# Patient Record
Sex: Male | Born: 1985 | Race: Black or African American | Hispanic: No | Marital: Single | Smoking: Current every day smoker
Health system: Southern US, Community
[De-identification: ages and names within clinical notes are randomized; demographics above are authoritative.]

## PROBLEM LIST (undated history)

## (undated) DIAGNOSIS — J45909 Unspecified asthma, uncomplicated: Secondary | ICD-10-CM

---

## 2012-06-27 ENCOUNTER — Emergency Department (HOSPITAL_COMMUNITY)
Admission: EM | Admit: 2012-06-27 | Discharge: 2012-06-27 | Disposition: A | Discharge status: Home / Self Care | Payer: Self-pay | Attending: Emergency Medicine | Admitting: Emergency Medicine

## 2012-06-27 ENCOUNTER — Encounter (HOSPITAL_COMMUNITY): Payer: Self-pay | Admitting: Cardiology

## 2012-06-27 DIAGNOSIS — F17200 Nicotine dependence, unspecified, uncomplicated: Secondary | ICD-10-CM | POA: Insufficient documentation

## 2012-06-27 DIAGNOSIS — J020 Streptococcal pharyngitis: Secondary | ICD-10-CM | POA: Insufficient documentation

## 2012-06-27 DIAGNOSIS — J45909 Unspecified asthma, uncomplicated: Secondary | ICD-10-CM | POA: Insufficient documentation

## 2012-06-27 DIAGNOSIS — R509 Fever, unspecified: Secondary | ICD-10-CM | POA: Insufficient documentation

## 2012-06-27 HISTORY — DX: Unspecified asthma, uncomplicated: J45.909

## 2012-06-27 MED ORDER — PENICILLIN G BENZATHINE 1200000 UNIT/2ML IM SUSP
1.2000 10*6.[IU] | Freq: Once | INTRAMUSCULAR | Status: AC
  Administered 2012-06-27: 1.2 10*6.[IU] via INTRAMUSCULAR
  Filled 2012-06-27: qty 2

## 2012-06-27 MED ORDER — OXYCODONE-ACETAMINOPHEN 5-325 MG PO TABS
1.0000 | ORAL_TABLET | Freq: Once | ORAL | Status: AC
  Administered 2012-06-27: 1 via ORAL
  Filled 2012-06-27: qty 1

## 2012-06-27 MED ORDER — HYDROCODONE-ACETAMINOPHEN 7.5-325 MG/15ML PO SOLN: 15.0000 mL | Freq: Four times a day (QID) | ORAL | Status: DC | PRN
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
## 2015-02-17 ENCOUNTER — Encounter (HOSPITAL_COMMUNITY): Payer: Self-pay | Admitting: *Deleted

## 2015-02-17 ENCOUNTER — Emergency Department (HOSPITAL_COMMUNITY): Payer: Self-pay

## 2015-02-17 ENCOUNTER — Emergency Department (HOSPITAL_COMMUNITY)
Admission: EM | Admit: 2015-02-17 | Discharge: 2015-02-17 | Disposition: A | Discharge status: Home / Self Care | Payer: Self-pay | Attending: Emergency Medicine | Admitting: Emergency Medicine

## 2015-02-17 DIAGNOSIS — Y998 Other external cause status: Secondary | ICD-10-CM | POA: Insufficient documentation

## 2015-02-17 DIAGNOSIS — F911 Conduct disorder, childhood-onset type: Secondary | ICD-10-CM | POA: Insufficient documentation

## 2015-02-17 DIAGNOSIS — S299XXA Unspecified injury of thorax, initial encounter: Secondary | ICD-10-CM | POA: Insufficient documentation

## 2015-02-17 DIAGNOSIS — S70211A Abrasion, right hip, initial encounter: Secondary | ICD-10-CM | POA: Insufficient documentation

## 2015-02-17 DIAGNOSIS — Y9389 Activity, other specified: Secondary | ICD-10-CM | POA: Insufficient documentation

## 2015-02-17 DIAGNOSIS — S3991XA Unspecified injury of abdomen, initial encounter: Secondary | ICD-10-CM

## 2015-02-17 DIAGNOSIS — S24109A Unspecified injury at unspecified level of thoracic spinal cord, initial encounter: Secondary | ICD-10-CM | POA: Insufficient documentation

## 2015-02-17 DIAGNOSIS — Y92410 Unspecified street and highway as the place of occurrence of the external cause: Secondary | ICD-10-CM | POA: Insufficient documentation

## 2015-02-17 LAB — TYPE AND SCREEN
ABO/RH(D): B POS
Antibody Screen: NEGATIVE
UNIT DIVISION: 0
UNIT DIVISION: 0

## 2015-02-17 LAB — COMPREHENSIVE METABOLIC PANEL
ALT: 51 U/L (ref 17–63)
AST: 119 U/L — AB (ref 15–41)
Albumin: 4.2 g/dL (ref 3.5–5.0)
Alkaline Phosphatase: 57 U/L (ref 38–126)
Anion gap: 12 (ref 5–15)
BUN: 10 mg/dL (ref 6–20)
CALCIUM: 9 mg/dL (ref 8.9–10.3)
CO2: 26 mmol/L (ref 22–32)
CREATININE: 1.3 mg/dL — AB (ref 0.61–1.24)
Chloride: 104 mmol/L (ref 101–111)
GFR calc Af Amer: >60 mL/min (ref >60)
GFR calc non Af Amer: >60 mL/min (ref >60)
Glucose, Bld: 99 mg/dL (ref 65–99)
Potassium: 3.1 mmol/L — ABNORMAL LOW (ref 3.5–5.1)
SODIUM: 142 mmol/L (ref 135–145)
TOTAL PROTEIN: 7.6 g/dL (ref 6.5–8.1)
Total Bilirubin: 1.1 mg/dL (ref 0.3–1.2)

## 2015-02-17 LAB — CBC
HCT: 43.5 % (ref 39.0–52.0)
HEMOGLOBIN: 15 g/dL (ref 13.0–17.0)
MCH: 29.6 pg (ref 26.0–34.0)
MCHC: 34.5 g/dL (ref 30.0–36.0)
MCV: 86 fL (ref 78.0–100.0)
Platelets: 228 10*3/uL (ref 150–400)
RBC: 5.06 MIL/uL (ref 4.22–5.81)
RDW: 13.7 % (ref 11.5–15.5)
WBC: 10.4 10*3/uL (ref 4.0–10.5)

## 2015-02-17 LAB — BLOOD PRODUCT ORDER (VERBAL) VERIFICATION

## 2015-02-17 LAB — I-STAT CHEM 8, ED
BUN: 11 mg/dL (ref 6–20)
CALCIUM ION: 1.1 mmol/L — AB (ref 1.12–1.23)
Chloride: 103 mmol/L (ref 101–111)
Creatinine, Ser: 1.5 mg/dL — ABNORMAL HIGH (ref 0.61–1.24)
GLUCOSE: 97 mg/dL (ref 65–99)
HEMATOCRIT: 49 % (ref 39.0–52.0)
Hemoglobin: 16.7 g/dL (ref 13.0–17.0)
POTASSIUM: 3.1 mmol/L — AB (ref 3.5–5.1)
Sodium: 143 mmol/L (ref 135–145)
TCO2: 24 mmol/L (ref 0–100)

## 2015-02-17 LAB — PREPARE FRESH FROZEN PLASMA
UNIT DIVISION: 0
UNIT DIVISION: 0

## 2015-02-17 LAB — CDS SEROLOGY

## 2015-02-17 LAB — ABO/RH: ABO/RH(D): B POS

## 2015-02-17 LAB — ETHANOL: Alcohol, Ethyl (B): 119 mg/dL — ABNORMAL HIGH (ref <5)

## 2015-02-17 LAB — PROTIME-INR
INR: 1.12 (ref 0.00–1.49)
Prothrombin Time: 14.6 seconds (ref 11.6–15.2)

## 2015-02-17 MED ORDER — FENTANYL CITRATE (PF) 100 MCG/2ML IJ SOLN
100.0000 ug | Freq: Once | INTRAMUSCULAR | Status: AC
  Administered 2015-02-17: 100 ug via INTRAVENOUS
  Filled 2015-02-17: qty 2

## 2015-02-17 MED ORDER — SODIUM CHLORIDE 0.9 % IV BOLUS (SEPSIS)
1000.0000 mL | Freq: Once | INTRAVENOUS | Status: AC
  Administered 2015-02-17: 1000 mL via INTRAVENOUS

## 2015-02-17 MED ORDER — IOHEXOL 300 MG/ML  SOLN
100.0000 mL | Freq: Once | INTRAMUSCULAR | Status: AC | PRN
  Administered 2015-02-17: 100 mL via INTRAVENOUS
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
## 2015-02-17 NOTE — ED Notes (Signed)
pts moither going home.  Gf at bedside
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
## 2015-02-17 NOTE — ED Notes (Signed)
The pts motherf is on the way here
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
## 2015-02-17 NOTE — ED Notes (Signed)
Warm blankets cursing every breath

## 2015-02-17 NOTE — ED Notes (Signed)
The pts mother is at the bedside
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
## 2015-02-17 NOTE — ED Notes (Signed)
Pt woke up but told the mom tio leave him alone

## 2015-02-17 NOTE — ED Notes (Signed)
Ambulated pt. Pt complaining of right hip pain while ambulating. Pt very slow moving, guarding right leg. Notified MD
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
## 2015-02-17 NOTE — ED Notes (Signed)
To ct
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
## 2015-02-18 ENCOUNTER — Encounter (HOSPITAL_COMMUNITY): Payer: Self-pay | Admitting: Cardiology

## 2017-03-25 ENCOUNTER — Emergency Department (HOSPITAL_COMMUNITY)
Admission: EM | Admit: 2017-03-25 | Discharge: 2017-03-25 | Disposition: A | Discharge status: Home / Self Care | Payer: Self-pay | Attending: Emergency Medicine | Admitting: Emergency Medicine

## 2017-03-25 ENCOUNTER — Encounter (HOSPITAL_COMMUNITY): Payer: Self-pay | Admitting: Emergency Medicine

## 2017-03-25 DIAGNOSIS — K029 Dental caries, unspecified: Secondary | ICD-10-CM | POA: Insufficient documentation

## 2017-03-25 DIAGNOSIS — K047 Periapical abscess without sinus: Secondary | ICD-10-CM

## 2017-03-25 DIAGNOSIS — J45909 Unspecified asthma, uncomplicated: Secondary | ICD-10-CM | POA: Insufficient documentation

## 2017-03-25 DIAGNOSIS — J020 Streptococcal pharyngitis: Secondary | ICD-10-CM | POA: Insufficient documentation

## 2017-03-25 LAB — RAPID STREP SCREEN (MED CTR MEBANE ONLY): STREPTOCOCCUS, GROUP A SCREEN (DIRECT): POSITIVE — AB

## 2017-03-25 MED ORDER — TRAMADOL HCL 50 MG PO TABS: 50.0000 mg | ORAL_TABLET | Freq: Four times a day (QID) | ORAL | 0 refills | Status: DC | PRN

## 2017-03-25 MED ORDER — PENICILLIN V POTASSIUM 250 MG PO TABS
500.0000 mg | ORAL_TABLET | Freq: Once | ORAL | Status: AC
  Administered 2017-03-25: 500 mg via ORAL
  Filled 2017-03-25: qty 2

## 2017-03-25 MED ORDER — IBUPROFEN 600 MG PO TABS: 600.0000 mg | ORAL_TABLET | Freq: Four times a day (QID) | ORAL | 0 refills | Status: DC | PRN

## 2017-03-25 MED ORDER — PENICILLIN V POTASSIUM 500 MG PO TABS: 500.0000 mg | ORAL_TABLET | Freq: Four times a day (QID) | ORAL | 0 refills | Status: AC

## 2017-03-25 MED ORDER — DEXAMETHASONE SODIUM PHOSPHATE 10 MG/ML IJ SOLN
10.0000 mg | Freq: Once | INTRAMUSCULAR | Status: AC
  Administered 2017-03-25: 10 mg via INTRAMUSCULAR
  Filled 2017-03-25: qty 1
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
## 2017-03-25 NOTE — ED Notes (Signed)
Error in documentation. Injection given in right deltoid.

## 2017-03-25 NOTE — ED Triage Notes (Signed)
Pt reports toothache on upper left side, saw dentist and was told teeth were infected but was not given any abx, began having sore throat and swollen glands last night.

## 2017-03-25 NOTE — ED Provider Notes (Signed)
[facility] Provider Note   CSN: [id number] Arrival date & time: 03/25/17  0940  By signing my name below, I, Linna DarnerRussell Turner, attest that this documentation has been prepared under the direction and in the presence of Audry Piliyler Ann Groeneveld, PA-C. Electronically Signed: Linna Darnerussell Turner, Scribe. 03/25/2017. 10:03 AM.  History   Chief Complaint Chief Complaint  Patient presents with  . Sore Throat  . Dental Pain   The history is provided by the patient. No language interpreter was used.    HPI Comments: Derek Rivera is a 31 y.o. male with a PMHx of asthma who presents to the Emergency Department complaining of intermittent, gradually worsening, left upper dental pain for three months. He states his pain is a result of "cracking my teeth with toothpicks". He was evaluated by a dentist three months ago and was advised to come to the hospital to receive antibiotics, but did not. He has been taking ibuprofen 800mg  without relief. Today he reports a sore throat and cervical lymphadenopathy in addition to his dental pain. His sore throat is exacerbated by swallowing but he denies any dysphagia. He tried hot tea earlier this morning without improvement of his sore throat. Patient's sister was ill with a sore throat last week but he otherwise has no known sick contacts. He denies fevers, chills, trismus, or any other associated symptoms.  Past Medical History:  Diagnosis Date  . Asthma     There are no active problems to display for this patient.   History reviewed. No pertinent surgical history.     Home Medications    Prior to Admission medications   Medication Sig Start Date End Date Taking? Authorizing Provider  albuterol (PROVENTIL HFA;VENTOLIN HFA) 108 (90 BASE) MCG/ACT inhaler Inhale 1-2 puffs into the lungs every 6 (six) hours as needed for wheezing or shortness of breath.    [provider]  hydrocodone-acetaminophen (HYCET) 7.5-325 MG/15ML solution Take 15 mLs by mouth 4  (four) times daily as needed for pain. 06/27/12   Benjiman CorePickering, Nathan, MD    Family History No family history on file.  Social History Social History  Substance Use Topics  . Smoking status: Never Smoker  . Smokeless tobacco: Not on file  . Alcohol use Yes     Allergies   Patient has no known allergies.   Review of Systems Review of Systems All other systems reviewed and are negative for acute change except as noted in the HPI. Physical Exam Updated Vital Signs BP (!) 144/98   Pulse 94   Temp 98.7 F (37.1 C) (Oral)   Resp 12   SpO2 100%   Physical Exam  Constitutional: He is oriented to person, place, and time. He appears well-developed and well-nourished. No distress.  HENT:  Head: Normocephalic and atraumatic.  Right Ear: Tympanic membrane, external ear and ear canal normal.  Left Ear: Tympanic membrane, external ear and ear canal normal.  Nose: Nose normal.  Mouth/Throat: Uvula is midline, oropharynx is clear and moist and mucous membranes are normal. No trismus in the jaw. No oropharyngeal exudate, posterior oropharyngeal erythema or tonsillar abscesses.  Dental caries noted to the left upper molars. No obvious signs of dental abscess. No fluctuance on palpation. FROM of neck without difficulty. No trismus. No uvular deviation. Patient phonating well without difficulty Tolerating secretions.   Eyes: Pupils are equal, round, and reactive to light. Conjunctivae and EOM are normal.  Neck: Normal range of motion. Neck supple. No tracheal deviation present.  Cardiovascular: Normal rate,  regular rhythm, S1 normal, S2 normal, normal heart sounds, intact distal pulses and normal pulses.   Pulmonary/Chest: Effort normal and breath sounds normal. No respiratory distress. He has no decreased breath sounds. He has no wheezes. He has no rhonchi. He has no rales.  Abdominal: Normal appearance and bowel sounds are normal. There is no tenderness.  Musculoskeletal: Normal range of  motion.  Neurological: He is alert and oriented to person, place, and time.  Skin: Skin is warm and dry.  Psychiatric: He has a normal mood and affect. His speech is normal and behavior is normal. Thought content normal.  Nursing note and vitals reviewed.  ED Treatments / Results  Labs (all labs ordered are listed, but only abnormal results are displayed) Labs Reviewed  RAPID STREP SCREEN (NOT AT [name]) - Abnormal; Notable for the following:       Result Value   Streptococcus, Group A Screen (Direct) POSITIVE (*)    All other components within normal limits    EKG  EKG Interpretation None       Radiology No results found.  Procedures Procedures (including critical care time)  DIAGNOSTIC STUDIES: Oxygen Saturation is 100% on RA, normal by my interpretation.    COORDINATION OF CARE: 10:02 AM Discussed treatment plan with pt at bedside and pt agreed to plan.  Medications Ordered in ED Medications - No data to display   Initial Impression / Assessment and Plan / ED Course  I have reviewed the triage vital signs and the nursing notes.  Pertinent labs & imaging results that were available during my care of the patient were reviewed by me and considered in my medical decision making (see chart for details).  I have reviewed the relevant previous healthcare records.I obtained HPI from historian.  ED Course:  Assessment: Dental pain associated with dental cary but no signs or symptoms of dental abscess with patient afebrile, non toxic appearing and swallowing secretions well. Exam unconcerning for Ludwig's angina or other deep tissue infection in neck. Palpable posterior cervical lymph nodes bilaterally. FROM neck without difficulty. Tolerating secretions. Phonating well. No posterior oropharyngeal edema. No uvula deviation.   As there is gum swelling, erythema, but without facial swelling, will treat with antibiotic and pain medicine. Urged patient to follow-up with dentist.  Rapid strep on patient was done due to hx of sick contacts with positive strep. Pt given penicillin and will be treated for both dental infection and strep.  I gave patient referral to dentist and stressed the importance of dental follow up for ultimate management of dental pain. Patient voices understanding and is agreeable to plan.  Disposition/Plan:  DC Home Additional Verbal discharge instructions given and discussed with patient.  Pt Instructed to f/u with Dentist in the next week for evaluation and treatment of symptoms. Return precautions given Pt acknowledges and agrees with plan  Supervising Physician Charlynne Pander, MD  Final Clinical Impressions(s) / ED Diagnoses   Final diagnoses:  Dental caries  Dental infection  Strep pharyngitis    New Prescriptions New Prescriptions   No medications on file   I personally performed the services described in this documentation, which was scribed in my presence. The recorded information has been reviewed and is accurate.    Audry Pili, PA-C 03/25/17 1025    Charlynne Pander, MD 03/26/17 [phone]

## 2017-03-25 NOTE — ED Notes (Signed)
PA in. 

## 2017-03-25 NOTE — ED Notes (Signed)
Pt states he has had upper left dental pain x 1.5 months. States saw a dentist but did not receive treatment. Also c/o throat pain and left neck pain.

## 2017-09-07 ENCOUNTER — Encounter (HOSPITAL_COMMUNITY): Payer: Self-pay | Admitting: Emergency Medicine

## 2017-09-07 ENCOUNTER — Other Ambulatory Visit: Payer: Self-pay

## 2017-09-07 ENCOUNTER — Ambulatory Visit (HOSPITAL_COMMUNITY)
Admission: EM | Admit: 2017-09-07 | Discharge: 2017-09-07 | Disposition: A | Discharge status: Home / Self Care | Payer: Self-pay | Attending: Urgent Care | Admitting: Urgent Care

## 2017-09-07 DIAGNOSIS — H9201 Otalgia, right ear: Secondary | ICD-10-CM | POA: Insufficient documentation

## 2017-09-07 DIAGNOSIS — Z113 Encounter for screening for infections with a predominantly sexual mode of transmission: Secondary | ICD-10-CM | POA: Insufficient documentation

## 2017-09-07 DIAGNOSIS — J45909 Unspecified asthma, uncomplicated: Secondary | ICD-10-CM | POA: Insufficient documentation

## 2017-09-07 DIAGNOSIS — L089 Local infection of the skin and subcutaneous tissue, unspecified: Secondary | ICD-10-CM | POA: Insufficient documentation

## 2017-09-07 DIAGNOSIS — Z7251 High risk heterosexual behavior: Secondary | ICD-10-CM

## 2017-09-07 MED ORDER — HYDROXYZINE HCL 25 MG PO TABS: 25.0000 mg | ORAL_TABLET | Freq: Four times a day (QID) | ORAL | 0 refills | Status: DC

## 2017-09-07 MED ORDER — CEPHALEXIN 500 MG PO CAPS: 500.0000 mg | ORAL_CAPSULE | Freq: Three times a day (TID) | ORAL | 0 refills | Status: DC

## 2017-09-07 NOTE — ED Triage Notes (Signed)
Pt reports a bump in his right ear that is causing him loss of hearing and pain.  He states he had a bump on his left chest 3-4 weeks ago that has since gone away.

## 2017-09-07 NOTE — ED Provider Notes (Signed)
  MRN: [id number] DOB: 03/01/1986  Subjective:   Derek Rivera is a 32 y.o. male presenting for several day history of worsening right ear pain, "bump" with slight drainage when he scratches the area. Denies redness, swelling, fever, sinus congestion, sinus pain, sore throat, cough. Patient would also like to get STI testing, does this for a routine check up. Denies dysuria, hematuria, urinary frequency, penile discharge, penile swelling, testicular pain, testicular swelling, anal pain, groin pain.   Derek Rivera has No Known Allergies. Derek Rivera  has a past medical history of Asthma. Denies past surgical history.  Objective:   Vitals: BP (!) 134/94 (BP Location: Left Arm)   Pulse 75   Temp 98.4 F (36.9 C) (Oral)   SpO2 100%   Physical Exam  Constitutional: He is oriented to person, place, and time. He appears well-developed and well-nourished.  HENT:  Right Ear: No lacerations. There is swelling (with associated excoriation over distal anterior portion of right ear canal) and tenderness (over excoriation). No drainage. No foreign bodies. No mastoid tenderness. Tympanic membrane is not perforated.  Left Ear: Tympanic membrane normal. No drainage, swelling or tenderness. Tympanic membrane is not perforated.  Cardiovascular: Normal rate.  Pulmonary/Chest: Effort normal.  Neurological: He is alert and oriented to person, place, and time.   Assessment and Plan :   Right ear pain  Superficial skin infection  Will have patient start Keflex for a superficial skin infection of the right ear canal. Return-to-clinic precautions discussed, patient verbalized understanding. Labs pending for his STI testing.   Wallis BambergMani, Haseeb Fiallos, PA-C 09/07/17 1500

## 2017-09-07 NOTE — Discharge Instructions (Addendum)
We will try Keflex for a superficial skin infection of your ear. Use Vistaril for itching. If your symptoms worsen including fever, swelling, discharge then please come back for a recheck.

## 2017-09-08 LAB — RPR: RPR Ser Ql: NONREACTIVE

## 2017-09-08 LAB — HIV ANTIBODY (ROUTINE TESTING W REFLEX): HIV Screen 4th Generation wRfx: NONREACTIVE

## 2017-09-15 ENCOUNTER — Telehealth (HOSPITAL_COMMUNITY): Payer: Self-pay | Admitting: Emergency Medicine

## 2017-09-15 NOTE — Telephone Encounter (Signed)
Pt called needing lab results .... HIV/RPR given   Notified him that Urine Cytology was not sent to lab   Pt sts he gave us a urine sample   Asked if he can come in and drop off another sample .... Pt sts he will come in this weekend  Told him it will be no charge

## 2017-10-28 ENCOUNTER — Encounter (HOSPITAL_COMMUNITY): Payer: Self-pay | Admitting: Emergency Medicine

## 2017-10-28 ENCOUNTER — Ambulatory Visit (HOSPITAL_COMMUNITY)
Admission: EM | Admit: 2017-10-28 | Discharge: 2017-10-28 | Disposition: A | Discharge status: Home / Self Care | Payer: Self-pay | Attending: Family Medicine | Admitting: Family Medicine

## 2017-10-28 ENCOUNTER — Other Ambulatory Visit: Payer: Self-pay

## 2017-10-28 DIAGNOSIS — Z113 Encounter for screening for infections with a predominantly sexual mode of transmission: Secondary | ICD-10-CM | POA: Insufficient documentation

## 2017-10-28 DIAGNOSIS — J45909 Unspecified asthma, uncomplicated: Secondary | ICD-10-CM | POA: Insufficient documentation

## 2017-10-28 DIAGNOSIS — Z79899 Other long term (current) drug therapy: Secondary | ICD-10-CM | POA: Insufficient documentation

## 2017-10-28 DIAGNOSIS — R1030 Lower abdominal pain, unspecified: Secondary | ICD-10-CM | POA: Insufficient documentation

## 2017-10-28 DIAGNOSIS — Z202 Contact with and (suspected) exposure to infections with a predominantly sexual mode of transmission: Secondary | ICD-10-CM

## 2017-10-28 LAB — POCT URINALYSIS DIP (DEVICE)
BILIRUBIN URINE: NEGATIVE
GLUCOSE, UA: NEGATIVE mg/dL
Hgb urine dipstick: NEGATIVE
KETONES UR: NEGATIVE mg/dL
Leukocytes, UA: NEGATIVE
Nitrite: NEGATIVE
PROTEIN: NEGATIVE mg/dL
Specific Gravity, Urine: 1.015 (ref 1.005–1.030)
Urobilinogen, UA: 1 mg/dL (ref 0.0–1.0)
pH: 7 (ref 5.0–8.0)

## 2017-10-28 MED ORDER — CEFTRIAXONE SODIUM 250 MG IJ SOLR
INTRAMUSCULAR | Status: AC
  Filled 2017-10-28: qty 250

## 2017-10-28 MED ORDER — AZITHROMYCIN 250 MG PO TABS
ORAL_TABLET | ORAL | Status: AC
  Filled 2017-10-28: qty 4

## 2017-10-28 MED ORDER — CEFTRIAXONE SODIUM 250 MG IJ SOLR
250.0000 mg | Freq: Once | INTRAMUSCULAR | Status: AC
  Administered 2017-10-28: 250 mg via INTRAMUSCULAR

## 2017-10-28 MED ORDER — AZITHROMYCIN 250 MG PO TABS
1000.0000 mg | ORAL_TABLET | Freq: Once | ORAL | Status: AC
  Administered 2017-10-28: 1000 mg via ORAL

## 2017-10-28 NOTE — ED Provider Notes (Addendum)
[facility]    CSN: [id number] Arrival date & time: 10/28/17  1004     History   Chief Complaint Chief Complaint  Patient presents with  . SEXUALLY TRANSMITTED DISEASE  . Abdominal Pain    HPI Derek Rivera is a 32 y.o. male no significant past medical history presenting with concern for STDs and abdominal pain.  States that over the past week he has had dull knot sensation in bilateral lower abdomen that comes and goes.  It is not associated with eating, happens when he is at rest and it goes away.  Starting yesterday he started to notice a tingling sensation with urination.  Denies any discharge, nausea, vomiting.  States his bowels have been normal, last one this morning around 8:00 and was normal in consistency and size.  States he is normally regular.  Denies any testicular pain or swelling.  Denies any new rashes bumps or lesions to genital area.  Patient also concerned about some itching he has all over at night.  He looked up online and saw this is could be skin cancer.  Denies any specific changes in his skin.  HPI  Past Medical History:  Diagnosis Date  . Asthma     There are no active problems to display for this patient.   History reviewed. No pertinent surgical history.     Home Medications    Prior to Admission medications   Medication Sig Start Date End Date Taking? Authorizing Provider  albuterol (PROVENTIL HFA;VENTOLIN HFA) 108 (90 BASE) MCG/ACT inhaler Inhale 1-2 puffs into the lungs every 6 (six) hours as needed for wheezing or shortness of breath.    [provider]  cephALEXin (KEFLEX) 500 MG capsule Take 1 capsule (500 mg total) by mouth 3 (three) times daily. Patient not taking: Reported on 10/28/2017 09/07/17   Wallis BambergMani, Mario, PA-C  hydrocodone-acetaminophen (HYCET) 7.5-325 MG/15ML solution Take 15 mLs by mouth 4 (four) times daily as needed for pain. Patient not taking: Reported on 10/28/2017 06/27/12   Benjiman CorePickering, Nathan, MD    hydrOXYzine (ATARAX/VISTARIL) 25 MG tablet Take 1 tablet (25 mg total) by mouth every 6 (six) hours. Patient not taking: Reported on 10/28/2017 09/07/17   Wallis BambergMani, Mario, PA-C  ibuprofen (ADVIL,MOTRIN) 600 MG tablet Take 1 tablet (600 mg total) by mouth every 6 (six) hours as needed. Patient not taking: Reported on 10/28/2017 03/25/17   Audry PiliMohr, Tyler, PA-C  traMADol (ULTRAM) 50 MG tablet Take 1 tablet (50 mg total) by mouth every 6 (six) hours as needed. Patient not taking: Reported on 10/28/2017 03/25/17   Audry PiliMohr, Tyler, PA-C    Family History No family history on file.  Social History Social History   Tobacco Use  . Smoking status: Never Smoker  . Smokeless tobacco: Never Used  Substance Use Topics  . Alcohol use: Yes  . Drug use: Yes    Types: Marijuana     Allergies   Patient has no known allergies.   Review of Systems Review of Systems  Constitutional: Negative for fever.  HENT: Negative for sore throat.   Respiratory: Negative for shortness of breath.   Cardiovascular: Negative for chest pain.  Gastrointestinal: Positive for abdominal pain. Negative for nausea and vomiting.  Genitourinary: Positive for dysuria. Negative for difficulty urinating, discharge, frequency, penile pain, penile swelling, scrotal swelling and testicular pain.  Skin: Negative for rash.  Neurological: Negative for dizziness, light-headedness and headaches.     Physical Exam Triage Vital Signs ED Triage Vitals [10/28/17  1032]  Enc Vitals Group     BP 139/87     Pulse Rate 67     Resp 16     Temp 98 F (36.7 C)     Temp src      SpO2 100 %     Weight      Height      Head Circumference      Peak Flow      Pain Score      Pain Loc      Pain Edu?      Excl. in GC?    No data found.  Updated Vital Signs BP 139/87   Pulse 67   Temp 98 F (36.7 C)   Resp 16   SpO2 100%   Visual Acuity Right Eye Distance:   Left Eye Distance:   Bilateral Distance:    Right Eye Near:   Left Eye  Near:    Bilateral Near:     Physical Exam  Constitutional: He appears well-developed and well-nourished.  HENT:  Head: Normocephalic and atraumatic.  Eyes: Conjunctivae are normal.  Neck: Neck supple.  Cardiovascular: Normal rate and regular rhythm.  No murmur heard. Pulmonary/Chest: Effort normal and breath sounds normal. No respiratory distress.  Abdominal: Soft. There is tenderness.  Mild tenderness to right and left lower quadrants, negative rebound negative McBurney's negative Rovsing's.  Genitourinary:  Genitourinary Comments: Normal male genitalia, no rashes or lesions to penis or scrotum, no scrotal swelling.  No discharge observed in urethral meatus.  Musculoskeletal: He exhibits no edema.  Neurological: He is alert.  Skin: Skin is warm and dry.  Skin appears dry, cracked on lower extremities.  Psychiatric: He has a normal mood and affect.  Nursing note and vitals reviewed.    UC Treatments / Results  Labs (all labs ordered are listed, but only abnormal results are displayed) Labs Reviewed  POCT URINALYSIS DIP (DEVICE)  URINE CYTOLOGY ANCILLARY ONLY    EKG  EKG Interpretation None       Radiology No results found.  Procedures Procedures (including critical care time)  Medications Ordered in UC Medications  cefTRIAXone (ROCEPHIN) injection 250 mg (not administered)  azithromycin (ZITHROMAX) tablet 1,000 mg (not administered)     Initial Impression / Assessment and Plan / UC Course  I have reviewed the triage vital signs and the nursing notes.  Pertinent labs & imaging results that were available during my care of the patient were reviewed by me and considered in my medical decision making (see chart for details).     Patient requesting empiric treatment.  We will go ahead and treat for gonorrhea and chlamydia.  Urine cytology obtained.  Abdominal pain does not appear acute or peritoneal. Will continue to monitor for development of further symptoms.  Discussed strict return precautions. Patient verbalized understanding and is agreeable with plan.  Advised patient to apply lotion or thicker moisturizer cream to help with the dryness this could be a cause of itching.  And to continue to monitor his skin for any specific changes.    Final Clinical Impressions(s) / UC Diagnoses   Final diagnoses:  Lower abdominal pain  Screen for STD (sexually transmitted disease)    ED Discharge Orders    None       Controlled Substance Prescriptions [place] Controlled Substance Registry consulted? Not Applicable   Lew Dawes, PA-C 10/28/17 1101    Wieters, [place] C, [place] 10/28/17 1101

## 2017-10-28 NOTE — ED Triage Notes (Signed)
Pt states hes here for std check, states off and on stomach pain and sometimes he feels tingly when he pees.

## 2017-10-28 NOTE — Discharge Instructions (Signed)
We have treated you today for gonorrhea and chlamydia, with Rocephin and azithromycin. Please refrain from sexual activity for 7 days while medicine is clearing infection. ° °We are testing you for Gonorrhea, Chlamydia and Trichomonas. We will call you if anything is positive and let you know if you require any further treatment. Please inform partner of any positive results. ° °Please return if symptoms not improving with treatment, development of fever, nausea, vomiting, abdominal pain, scrotal pain. °

## 2017-10-31 LAB — URINE CYTOLOGY ANCILLARY ONLY
CHLAMYDIA, DNA PROBE: NEGATIVE
Neisseria Gonorrhea: NEGATIVE
Trichomonas: NEGATIVE

## 2017-11-16 ENCOUNTER — Other Ambulatory Visit: Payer: Self-pay

## 2017-11-16 ENCOUNTER — Encounter (HOSPITAL_COMMUNITY): Payer: Self-pay | Admitting: Emergency Medicine

## 2017-11-16 ENCOUNTER — Ambulatory Visit (HOSPITAL_COMMUNITY)
Admission: EM | Admit: 2017-11-16 | Discharge: 2017-11-16 | Disposition: A | Discharge status: Home / Self Care | Payer: Medicaid Other | Attending: Family Medicine | Admitting: Family Medicine

## 2017-11-16 DIAGNOSIS — L309 Dermatitis, unspecified: Secondary | ICD-10-CM

## 2017-11-16 LAB — POCT URINALYSIS DIP (DEVICE)
Bilirubin Urine: NEGATIVE
Glucose, UA: NEGATIVE mg/dL
HGB URINE DIPSTICK: NEGATIVE
Ketones, ur: NEGATIVE mg/dL
Leukocytes, UA: NEGATIVE
NITRITE: NEGATIVE
PH: 7 (ref 5.0–8.0)
PROTEIN: NEGATIVE mg/dL
Specific Gravity, Urine: 1.01 (ref 1.005–1.030)
UROBILINOGEN UA: 1 mg/dL (ref 0.0–1.0)

## 2017-11-16 LAB — POCT I-STAT, CHEM 8
BUN: 13 mg/dL (ref 6–20)
CALCIUM ION: 1.2 mmol/L (ref 1.15–1.40)
Chloride: 101 mmol/L (ref 101–111)
Creatinine, Ser: 1 mg/dL (ref 0.61–1.24)
GLUCOSE: 100 mg/dL — AB (ref 65–99)
HCT: 45 % (ref 39.0–52.0)
HEMOGLOBIN: 15.3 g/dL (ref 13.0–17.0)
Potassium: 3.4 mmol/L — ABNORMAL LOW (ref 3.5–5.1)
Sodium: 141 mmol/L (ref 135–145)
TCO2: 27 mmol/L (ref 22–32)

## 2017-11-16 MED ORDER — TRIAMCINOLONE ACETONIDE 0.1 % EX CREA: 1.0000 "application " | TOPICAL_CREAM | Freq: Two times a day (BID) | CUTANEOUS | 0 refills | Status: AC

## 2017-11-16 NOTE — ED Provider Notes (Signed)
[facility] CARE CENTER   [id number] 11/16/17 Arrival Time: 1420   SUBJECTIVE:  Derek Rivera is a 32 y.o. male who presents to the urgent care with complaint of "when I pee, it still is dripping out afterwards. My hands and my feet have been swelling up too" Symptoms x1 month.   Past Medical History:  Diagnosis Date  . Asthma    No family history on file. Social History   Socioeconomic History  . Marital status: Single    Spouse name: Not on file  . Number of children: Not on file  . Years of education: Not on file  . Highest education level: Not on file  Social Needs  . Financial resource strain: Not on file  . Food insecurity - worry: Not on file  . Food insecurity - inability: Not on file  . Transportation needs - medical: Not on file  . Transportation needs - non-medical: Not on file  Occupational History  . Not on file  Tobacco Use  . Smoking status: Never Smoker  . Smokeless tobacco: Never Used  Substance and Sexual Activity  . Alcohol use: Yes  . Drug use: Yes    Types: Marijuana  . Sexual activity: Not on file  Other Topics Concern  . Not on file  Social History Narrative   ** Merged History Encounter **       No outpatient medications have been marked as taking for the 11/16/17 encounter [facility] Encounter).   No Known Allergies    ROS: As per HPI, remainder of ROS negative.   OBJECTIVE:   Vitals:   11/16/17 1531  BP: (!) 140/98  Pulse: 66  Resp: 18  Temp: 98.1 F (36.7 C)  SpO2: 100%     General appearance: alert; no distress Eyes: PERRL; EOMI; conjunctiva normal HENT: normocephalic; atraumatic; TMs normal, canal normal, external ears normal without trauma; nasal mucosa normal; oral mucosa normal Neck: supple Lungs: clear to auscultation bilaterally Heart: regular rate and rhythm Abdomen: soft, non-tender; bowel sounds normal; no masses or organomegaly; no guarding or rebound tenderness Back: no CVA tenderness Extremities: no  cyanosis or edema; symmetrical with no gross deformities Skin: warm and dry Neurologic: normal gait; grossly normal Psychological: alert and cooperative; normal mood and affect      Labs:  Results for orders placed or performed during the hospital encounter of 11/16/17  POCT urinalysis dip (device)  Result Value Ref Range   Glucose, UA NEGATIVE NEGATIVE mg/dL   Bilirubin Urine NEGATIVE NEGATIVE   Ketones, ur NEGATIVE NEGATIVE mg/dL   Specific Gravity, Urine 1.010 1.005 - 1.030   Hgb urine dipstick NEGATIVE NEGATIVE   pH 7.0 5.0 - 8.0   Protein, ur NEGATIVE NEGATIVE mg/dL   Urobilinogen, UA 1.0 0.0 - 1.0 mg/dL   Nitrite NEGATIVE NEGATIVE   Leukocytes, UA NEGATIVE NEGATIVE  I-STAT, chem 8  Result Value Ref Range   Sodium 141 135 - 145 mmol/L   Potassium 3.4 (L) 3.5 - 5.1 mmol/L   Chloride 101 101 - 111 mmol/L   BUN 13 6 - 20 mg/dL   Creatinine, Ser 4.09 0.61 - 1.24 mg/dL   Glucose, Bld 811 (H) 65 - 99 mg/dL   Calcium, Ion 9.14 1.15 - 1.40 mmol/L   TCO2 27 22 - 32 mmol/L   Hemoglobin 15.3 13.0 - 17.0 g/dL   HCT 78.2 95.6 - 21.3 %    Labs Reviewed  POCT I-STAT, CHEM 8 - Abnormal; Notable for the following components:  Result Value   Potassium 3.4 (*)    Glucose, Bld 100 (*)    All other components within normal limits  POCT URINALYSIS DIP (DEVICE)    No results found.     ASSESSMENT & PLAN:  1. Eczema, unspecified type     Meds ordered this encounter  Medications  . triamcinolone cream (KENALOG) 0.1 %    Sig: Apply 1 application topically 2 (two) times daily.    Dispense:  453 g    Refill:  0    Reviewed expectations re: course of current medical issues. Questions answered. Outlined signs and symptoms indicating need for more acute intervention. Patient verbalized understanding. After Visit Summary given.    Procedures:      Elvina SidleLauenstein, Aundreya Souffrant, MD 11/16/17 [phone]

## 2017-11-16 NOTE — Discharge Instructions (Signed)
If the urine continues to be a problem, make an appointment with the urologist below.

## 2017-11-16 NOTE — ED Triage Notes (Signed)
Pt states "when I pee, it still is dripping out afterwards. My hands and my feet have been swelling up too" Symptoms x1 month.

## 2017-12-19 ENCOUNTER — Emergency Department (HOSPITAL_COMMUNITY)
Admission: EM | Admit: 2017-12-19 | Discharge: 2017-12-20 | Disposition: A | Discharge status: Home / Self Care | Payer: Medicaid Other | Attending: Emergency Medicine | Admitting: Emergency Medicine

## 2017-12-19 ENCOUNTER — Other Ambulatory Visit: Payer: Self-pay

## 2017-12-19 ENCOUNTER — Encounter (HOSPITAL_COMMUNITY): Payer: Self-pay | Admitting: Emergency Medicine

## 2017-12-19 DIAGNOSIS — R1010 Upper abdominal pain, unspecified: Secondary | ICD-10-CM | POA: Insufficient documentation

## 2017-12-19 DIAGNOSIS — F17210 Nicotine dependence, cigarettes, uncomplicated: Secondary | ICD-10-CM | POA: Insufficient documentation

## 2017-12-19 DIAGNOSIS — J45909 Unspecified asthma, uncomplicated: Secondary | ICD-10-CM | POA: Insufficient documentation

## 2017-12-19 LAB — COMPREHENSIVE METABOLIC PANEL
ALBUMIN: 4.1 g/dL (ref 3.5–5.0)
ALT: 16 U/L — ABNORMAL LOW (ref 17–63)
ANION GAP: 10 (ref 5–15)
AST: 25 U/L (ref 15–41)
Alkaline Phosphatase: 58 U/L (ref 38–126)
BILIRUBIN TOTAL: 0.7 mg/dL (ref 0.3–1.2)
BUN: 13 mg/dL (ref 6–20)
CO2: 25 mmol/L (ref 22–32)
Calcium: 9.2 mg/dL (ref 8.9–10.3)
Chloride: 104 mmol/L (ref 101–111)
Creatinine, Ser: 1.13 mg/dL (ref 0.61–1.24)
GFR calc Af Amer: >60 mL/min (ref >60)
GFR calc non Af Amer: >60 mL/min (ref >60)
GLUCOSE: 102 mg/dL — AB (ref 65–99)
POTASSIUM: 3.4 mmol/L — AB (ref 3.5–5.1)
Sodium: 139 mmol/L (ref 135–145)
TOTAL PROTEIN: 7.4 g/dL (ref 6.5–8.1)

## 2017-12-19 LAB — URINALYSIS, ROUTINE W REFLEX MICROSCOPIC
BILIRUBIN URINE: NEGATIVE
Glucose, UA: NEGATIVE mg/dL
Hgb urine dipstick: NEGATIVE
Ketones, ur: NEGATIVE mg/dL
Leukocytes, UA: NEGATIVE
NITRITE: NEGATIVE
PROTEIN: NEGATIVE mg/dL
SPECIFIC GRAVITY, URINE: 1.028 (ref 1.005–1.030)
pH: 6 (ref 5.0–8.0)

## 2017-12-19 LAB — CBC
HEMATOCRIT: 41.8 % (ref 39.0–52.0)
Hemoglobin: 14.2 g/dL (ref 13.0–17.0)
MCH: 29.2 pg (ref 26.0–34.0)
MCHC: 34 g/dL (ref 30.0–36.0)
MCV: 85.8 fL (ref 78.0–100.0)
Platelets: 251 10*3/uL (ref 150–400)
RBC: 4.87 MIL/uL (ref 4.22–5.81)
RDW: 13.9 % (ref 11.5–15.5)
WBC: 8.1 10*3/uL (ref 4.0–10.5)

## 2017-12-19 LAB — LIPASE, BLOOD: Lipase: 32 U/L (ref 11–51)

## 2017-12-19 NOTE — ED Triage Notes (Signed)
Pt reports L sided abdominal pain x1 week, reports some episodes of diarrhea. Reports he has been under increased stress lately, states increased ETOH intake and decreased PO intake. Increased smoking as well.

## 2017-12-20 MED ORDER — ACETAMINOPHEN 500 MG PO TABS
1000.0000 mg | ORAL_TABLET | Freq: Once | ORAL | Status: AC
  Administered 2017-12-20: 1000 mg via ORAL
  Filled 2017-12-20: qty 2

## 2017-12-20 MED ORDER — FAMOTIDINE 20 MG PO TABS
20.0000 mg | ORAL_TABLET | Freq: Once | ORAL | Status: AC
  Administered 2017-12-20: 20 mg via ORAL
  Filled 2017-12-20: qty 1

## 2017-12-20 MED ORDER — PANTOPRAZOLE SODIUM 40 MG PO TBEC: 40.0000 mg | DELAYED_RELEASE_TABLET | Freq: Every day | ORAL | 0 refills | Status: AC

## 2017-12-20 MED ORDER — POTASSIUM CHLORIDE CRYS ER 20 MEQ PO TBCR
40.0000 meq | EXTENDED_RELEASE_TABLET | Freq: Once | ORAL | Status: AC
  Administered 2017-12-20: 40 meq via ORAL
  Filled 2017-12-20: qty 2

## 2017-12-20 NOTE — ED Notes (Signed)
Pt aware of waiting status 

## 2017-12-20 NOTE — ED Provider Notes (Signed)
[facility] Provider Note   CSN: [id number] Arrival date & time: 12/19/17  2027     History   Chief Complaint Chief Complaint  Patient presents with  . Abdominal Pain    HPI Derek Rivera is a 32 y.o. male.  Patient c/o upper abd pain, esp left, for the past few weeks. Pain intermittent, dull, mild to moderate. Occasionally worse w eating, but no consistent exacerbating or alleviating factors. Denies radiation of pain, no back pain, no chest pain or discomfort. No vomiting. Normal appetite. Is having normal bms. No dysuria or hematuria. No hx pud, pancreatitis or gallstones. Does note increase stress and has been smoking and drinking more than normal. Denies depression. No trauma to area, no fall. No cough or uri symptoms. No fever or chills.   The history is provided by the patient.  Abdominal Pain   Pertinent negatives include fever, vomiting, dysuria and headaches.    Past Medical History:  Diagnosis Date  . Asthma     There are no active problems to display for this patient.   History reviewed. No pertinent surgical history.      Home Medications    Prior to Admission medications   Medication Sig Start Date End Date Taking? Authorizing Provider  triamcinolone cream (KENALOG) 0.1 % Apply 1 application topically 2 (two) times daily. 11/16/17   Elvina SidleLauenstein, Kurt, MD    Family History No family history on file.  Social History Social History   Tobacco Use  . Smoking status: Current Every Day Smoker    Types: Cigarettes, Cigars  . Smokeless tobacco: Never Used  Substance Use Topics  . Alcohol use: Yes  . Drug use: Yes    Types: Marijuana     Allergies   Patient has no known allergies.   Review of Systems Review of Systems  Constitutional: Negative for fever.  HENT: Negative for sore throat.   Eyes: Negative for redness.  Respiratory: Negative for shortness of breath.   Cardiovascular: Negative for chest  pain.  Gastrointestinal: Positive for abdominal pain. Negative for vomiting.  Genitourinary: Negative for dysuria and flank pain.  Musculoskeletal: Negative for back pain.  Skin: Negative for rash.  Neurological: Negative for headaches.  Hematological: Does not bruise/bleed easily.  Psychiatric/Behavioral: Negative for confusion.     Physical Exam Updated Vital Signs BP (!) 127/91 (BP Location: Left Arm)   Pulse 68   Temp 98 F (36.7 C) (Oral)   Resp 20   Ht 1.676 m (5\' 6" )   Wt 73.5 kg (162 lb)   SpO2 98%   BMI 26.15 kg/m   Physical Exam  Constitutional: He appears well-developed and well-nourished. No distress.  HENT:  Mouth/Throat: Oropharynx is clear and moist.  Eyes: Conjunctivae are normal. No scleral icterus.  Neck: Neck supple. No tracheal deviation present.  Cardiovascular: Normal rate, regular rhythm, normal heart sounds and intact distal pulses. Exam reveals no gallop and no friction rub.  No murmur heard. Pulmonary/Chest: Effort normal and breath sounds normal. No accessory muscle usage. No respiratory distress.  Abdominal: Soft. Bowel sounds are normal. He exhibits no distension and no mass. There is no tenderness. There is no rebound and no guarding. No hernia.  Genitourinary:  Genitourinary Comments: No cva tenderness  Musculoskeletal: He exhibits no edema.  Neurological: He is alert.  Speech normal. Steady gait.   Skin: Skin is warm and dry. He is not diaphoretic.  Psychiatric: He has a normal mood and affect.  Nursing note and vitals reviewed.    ED Treatments / Results  Labs (all labs ordered are listed, but only abnormal results are displayed) Results for orders placed or performed during the hospital encounter of 12/19/17  Lipase, blood  Result Value Ref Range   Lipase 32 11 - 51 U/L  Comprehensive metabolic panel  Result Value Ref Range   Sodium 139 135 - 145 mmol/L   Potassium 3.4 (L) 3.5 - 5.1 mmol/L   Chloride 104 101 - 111 mmol/L   CO2  25 22 - 32 mmol/L   Glucose, Bld 102 (H) 65 - 99 mg/dL   BUN 13 6 - 20 mg/dL   Creatinine, Ser 6.96 0.61 - 1.24 mg/dL   Calcium 9.2 8.9 - 29.5 mg/dL   Total Protein 7.4 6.5 - 8.1 g/dL   Albumin 4.1 3.5 - 5.0 g/dL   AST 25 15 - 41 U/L   ALT 16 (L) 17 - 63 U/L   Alkaline Phosphatase 58 38 - 126 U/L   Total Bilirubin 0.7 0.3 - 1.2 mg/dL   GFR calc non Af Amer >60 >60 mL/min   GFR calc Af Amer >60 >60 mL/min   Anion gap 10 5 - 15  CBC  Result Value Ref Range   WBC 8.1 4.0 - 10.5 K/uL   RBC 4.87 4.22 - 5.81 MIL/uL   Hemoglobin 14.2 13.0 - 17.0 g/dL   HCT 28.4 13.2 - 44.0 %   MCV 85.8 78.0 - 100.0 fL   MCH 29.2 26.0 - 34.0 pg   MCHC 34.0 30.0 - 36.0 g/dL   RDW 10.2 72.5 - 36.6 %   Platelets 251 150 - 400 K/uL  Urinalysis, Routine w reflex microscopic  Result Value Ref Range   Color, Urine YELLOW YELLOW   APPearance CLEAR CLEAR   Specific Gravity, Urine 1.028 1.005 - 1.030   pH 6.0 5.0 - 8.0   Glucose, UA NEGATIVE NEGATIVE mg/dL   Hgb urine dipstick NEGATIVE NEGATIVE   Bilirubin Urine NEGATIVE NEGATIVE   Ketones, ur NEGATIVE NEGATIVE mg/dL   Protein, ur NEGATIVE NEGATIVE mg/dL   Nitrite NEGATIVE NEGATIVE   Leukocytes, UA NEGATIVE NEGATIVE    EKG None  Radiology No results found.  Procedures Procedures (including critical care time)  Medications Ordered in ED Medications  potassium chloride SA (K-DUR,KLOR-CON) CR tablet 40 mEq (has no administration in time range)  famotidine (PEPCID) tablet 20 mg (has no administration in time range)  acetaminophen (TYLENOL) tablet 1,000 mg (has no administration in time range)     Initial Impression / Assessment and Plan / ED Course  I have reviewed the triage vital signs and the nursing notes.  Pertinent labs & imaging results that were available during my care of the patient were reviewed by me and considered in my medical decision making (see chart for details).  Labs sent from triage.  Reviewed nursing notes and prior  charts for additional history.   Labs reviewed - k slightly low. Wbc normal.  kcl 40 meq po.  pepcid and acetaminophen given for symptom relief.  Abd is soft non tender. Afebrile. Labs largely unremarkable.   Patient currently appears stable for d/c.     Final Clinical Impressions(s) / ED Diagnoses   Final diagnoses:  None    ED Discharge Orders    None       Cathren Laine, MD 12/20/17 [phone]

## 2017-12-20 NOTE — Discharge Instructions (Signed)
It was our pleasure to provide your ER care today - we hope that you feel better.  Take protonix (acid blocker medication). You may also try pepcid or maalox as need for symptom relief. Try acetaminophen as need for pain. Minimize smoking and alcohol use as that may aggravate your pain.   From today's labs, your potassium level is slightly low (3.4) - eat plenty of fruits and vegetables, and follow up with primary care doctor.   Follow up with primary care doctor in 1-2 weeks for recheck. Also have your blood pressure rechecked then as it is mildly high today.  Return to ER if worse, new symptoms, fevers, new or severe pain, persistent vomiting, other concern.

## 2018-01-09 ENCOUNTER — Encounter (HOSPITAL_COMMUNITY): Payer: Self-pay | Admitting: Emergency Medicine

## 2018-01-09 ENCOUNTER — Emergency Department (HOSPITAL_COMMUNITY)
Admission: EM | Admit: 2018-01-09 | Discharge: 2018-01-09 | Disposition: A | Discharge status: Home / Self Care | Payer: Medicaid Other | Attending: Emergency Medicine | Admitting: Emergency Medicine

## 2018-01-09 DIAGNOSIS — Y999 Unspecified external cause status: Secondary | ICD-10-CM | POA: Insufficient documentation

## 2018-01-09 DIAGNOSIS — S0501XA Injury of conjunctiva and corneal abrasion without foreign body, right eye, initial encounter: Secondary | ICD-10-CM | POA: Insufficient documentation

## 2018-01-09 DIAGNOSIS — J45909 Unspecified asthma, uncomplicated: Secondary | ICD-10-CM | POA: Insufficient documentation

## 2018-01-09 DIAGNOSIS — Y9289 Other specified places as the place of occurrence of the external cause: Secondary | ICD-10-CM | POA: Insufficient documentation

## 2018-01-09 DIAGNOSIS — F17210 Nicotine dependence, cigarettes, uncomplicated: Secondary | ICD-10-CM | POA: Insufficient documentation

## 2018-01-09 DIAGNOSIS — W51XXXA Accidental striking against or bumped into by another person, initial encounter: Secondary | ICD-10-CM | POA: Insufficient documentation

## 2018-01-09 DIAGNOSIS — Y9389 Activity, other specified: Secondary | ICD-10-CM | POA: Insufficient documentation

## 2018-01-09 DIAGNOSIS — Z79899 Other long term (current) drug therapy: Secondary | ICD-10-CM | POA: Insufficient documentation

## 2018-01-09 MED ORDER — SULFACETAMIDE SODIUM 10 % OP SOLN: 1.0000 [drp] | OPHTHALMIC | 0 refills | Status: AC

## 2018-01-09 MED ORDER — TETRACAINE HCL 0.5 % OP SOLN
1.0000 [drp] | Freq: Once | OPHTHALMIC | Status: AC
  Administered 2018-01-09: 1 [drp] via OPHTHALMIC
  Filled 2018-01-09: qty 4

## 2018-01-09 MED ORDER — FLUORESCEIN SODIUM 1 MG OP STRP
1.0000 | ORAL_STRIP | Freq: Once | OPHTHALMIC | Status: AC
  Administered 2018-01-09: 1 via OPHTHALMIC
  Filled 2018-01-09: qty 1

## 2018-01-09 MED ORDER — KETOROLAC TROMETHAMINE 30 MG/ML IJ SOLN
30.0000 mg | Freq: Once | INTRAMUSCULAR | Status: AC
  Administered 2018-01-09: 30 mg via INTRAMUSCULAR
  Filled 2018-01-09: qty 1

## 2018-01-09 NOTE — ED Notes (Signed)
Woods lamp on bedside table.  

## 2018-01-09 NOTE — ED Triage Notes (Signed)
Pt reports eye swelling and drainage X1 day.  Pain is giving him a headache
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
## 2018-04-22 ENCOUNTER — Emergency Department (HOSPITAL_COMMUNITY): Payer: Self-pay | Admitting: Anesthesiology

## 2018-04-22 ENCOUNTER — Other Ambulatory Visit: Payer: Self-pay

## 2018-04-22 ENCOUNTER — Emergency Department (HOSPITAL_COMMUNITY): Payer: Self-pay

## 2018-04-22 ENCOUNTER — Encounter (HOSPITAL_COMMUNITY)
Admission: EM | Disposition: A | Discharge status: Home / Self Care | Payer: Self-pay | Source: Home / Self Care | Attending: Vascular Surgery

## 2018-04-22 ENCOUNTER — Inpatient Hospital Stay (HOSPITAL_COMMUNITY)
Admission: EM | Admit: 2018-04-22 | Discharge: 2018-04-27 | DRG: 908 | Disposition: A | Discharge status: Home / Self Care | Payer: Self-pay | Attending: Vascular Surgery | Admitting: Vascular Surgery

## 2018-04-22 ENCOUNTER — Encounter (HOSPITAL_COMMUNITY): Payer: Self-pay

## 2018-04-22 DIAGNOSIS — I998 Other disorder of circulatory system: Secondary | ICD-10-CM | POA: Diagnosis present

## 2018-04-22 DIAGNOSIS — Z79899 Other long term (current) drug therapy: Secondary | ICD-10-CM

## 2018-04-22 DIAGNOSIS — S75022A Major laceration of femoral artery, left leg, initial encounter: Principal | ICD-10-CM | POA: Diagnosis present

## 2018-04-22 DIAGNOSIS — D62 Acute posthemorrhagic anemia: Secondary | ICD-10-CM | POA: Diagnosis not present

## 2018-04-22 DIAGNOSIS — S71132A Puncture wound without foreign body, left thigh, initial encounter: Secondary | ICD-10-CM | POA: Diagnosis present

## 2018-04-22 DIAGNOSIS — S7012XA Contusion of left thigh, initial encounter: Secondary | ICD-10-CM | POA: Diagnosis present

## 2018-04-22 DIAGNOSIS — F17210 Nicotine dependence, cigarettes, uncomplicated: Secondary | ICD-10-CM | POA: Diagnosis present

## 2018-04-22 DIAGNOSIS — W3400XA Accidental discharge from unspecified firearms or gun, initial encounter: Secondary | ICD-10-CM

## 2018-04-22 DIAGNOSIS — I959 Hypotension, unspecified: Secondary | ICD-10-CM | POA: Diagnosis present

## 2018-04-22 DIAGNOSIS — S71139A Puncture wound without foreign body, unspecified thigh, initial encounter: Secondary | ICD-10-CM

## 2018-04-22 DIAGNOSIS — F1290 Cannabis use, unspecified, uncomplicated: Secondary | ICD-10-CM | POA: Diagnosis present

## 2018-04-22 HISTORY — PX: FEMORAL ARTERY EXPLORATION: SHX5160

## 2018-04-22 LAB — POCT I-STAT 7, (LYTES, BLD GAS, ICA,H+H)
ACID-BASE DEFICIT: 10 mmol/L — AB (ref 0.0–2.0)
ACID-BASE DEFICIT: 8 mmol/L — AB (ref 0.0–2.0)
ACID-BASE DEFICIT: 9 mmol/L — AB (ref 0.0–2.0)
Acid-base deficit: 8 mmol/L — ABNORMAL HIGH (ref 0.0–2.0)
BICARBONATE: 19.2 mmol/L — AB (ref 20.0–28.0)
BICARBONATE: 18 mmol/L — AB (ref 20.0–28.0)
Bicarbonate: 17.3 mmol/L — ABNORMAL LOW (ref 20.0–28.0)
Bicarbonate: 17 mmol/L — ABNORMAL LOW (ref 20.0–28.0)
Calcium, Ion: 1.03 mmol/L — ABNORMAL LOW (ref 1.15–1.40)
Calcium, Ion: 0.97 mmol/L — ABNORMAL LOW (ref 1.15–1.40)
Calcium, Ion: 1.19 mmol/L (ref 1.15–1.40)
Calcium, Ion: 1.33 mmol/L (ref 1.15–1.40)
HCT: 30 % — ABNORMAL LOW (ref 39.0–52.0)
HCT: 33 % — ABNORMAL LOW (ref 39.0–52.0)
HCT: 23 % — ABNORMAL LOW (ref 39.0–52.0)
HEMATOCRIT: 24 % — AB (ref 39.0–52.0)
HEMOGLOBIN: 11.2 g/dL — AB (ref 13.0–17.0)
HEMOGLOBIN: 7.8 g/dL — AB (ref 13.0–17.0)
HEMOGLOBIN: 8.2 g/dL — AB (ref 13.0–17.0)
Hemoglobin: 10.2 g/dL — ABNORMAL LOW (ref 13.0–17.0)
O2 SAT: 72 %
O2 SAT: 100 %
O2 SAT: 100 %
O2 Saturation: 99 %
PCO2 ART: 43.1 mmHg (ref 32.0–48.0)
PCO2 ART: 34.1 mmHg (ref 32.0–48.0)
PCO2 ART: 35.3 mmHg (ref 32.0–48.0)
PH ART: 7.212 — AB (ref 7.350–7.450)
PH ART: 7.322 — AB (ref 7.350–7.450)
PH ART: 7.283 — AB (ref 7.350–7.450)
PO2 ART: 40 mmHg — AB (ref 83.0–108.0)
PO2 ART: 280 mmHg — AB (ref 83.0–108.0)
PO2 ART: 141 mmHg — AB (ref 83.0–108.0)
POTASSIUM: 4.4 mmol/L (ref 3.5–5.1)
POTASSIUM: 4.4 mmol/L (ref 3.5–5.1)
Patient temperature: 35.3
Potassium: 4.2 mmol/L (ref 3.5–5.1)
Potassium: 4.4 mmol/L (ref 3.5–5.1)
Sodium: 146 mmol/L — ABNORMAL HIGH (ref 135–145)
Sodium: 143 mmol/L (ref 135–145)
Sodium: 143 mmol/L (ref 135–145)
Sodium: 144 mmol/L (ref 135–145)
TCO2: 21 mmol/L — ABNORMAL LOW (ref 22–32)
TCO2: 19 mmol/L — ABNORMAL LOW (ref 22–32)
TCO2: 19 mmol/L — AB (ref 22–32)
TCO2: 18 mmol/L — ABNORMAL LOW (ref 22–32)
pCO2 arterial: 42.3 mmHg (ref 32.0–48.0)
pH, Arterial: 7.247 — ABNORMAL LOW (ref 7.350–7.450)
pO2, Arterial: 300 mmHg — ABNORMAL HIGH (ref 83.0–108.0)

## 2018-04-22 LAB — I-STAT CHEM 8, ED
BUN: 10 mg/dL (ref 6–20)
CREATININE: 1.7 mg/dL — AB (ref 0.61–1.24)
Calcium, Ion: 1.03 mmol/L — ABNORMAL LOW (ref 1.15–1.40)
Chloride: 107 mmol/L (ref 98–111)
GLUCOSE: 158 mg/dL — AB (ref 70–99)
HCT: 35 % — ABNORMAL LOW (ref 39.0–52.0)
HEMOGLOBIN: 11.9 g/dL — AB (ref 13.0–17.0)
POTASSIUM: 2.6 mmol/L — AB (ref 3.5–5.1)
Sodium: 146 mmol/L — ABNORMAL HIGH (ref 135–145)
TCO2: 19 mmol/L — ABNORMAL LOW (ref 22–32)

## 2018-04-22 LAB — I-STAT CG4 LACTIC ACID, ED: LACTIC ACID, VENOUS: 7.28 mmol/L — AB (ref 0.5–1.9)

## 2018-04-22 LAB — CBC
HCT: 35.6 % — ABNORMAL LOW (ref 39.0–52.0)
HCT: 37.7 % — ABNORMAL LOW (ref 39.0–52.0)
Hemoglobin: 11.8 g/dL — ABNORMAL LOW (ref 13.0–17.0)
Hemoglobin: 11.5 g/dL — ABNORMAL LOW (ref 13.0–17.0)
MCH: 29.4 pg (ref 26.0–34.0)
MCH: 29.3 pg (ref 26.0–34.0)
MCHC: 33.1 g/dL (ref 30.0–36.0)
MCHC: 30.5 g/dL (ref 30.0–36.0)
MCV: 88.8 fL (ref 78.0–100.0)
MCV: 96.2 fL (ref 78.0–100.0)
PLATELETS: 252 10*3/uL (ref 150–400)
PLATELETS: 159 10*3/uL (ref 150–400)
RBC: 4.01 MIL/uL — AB (ref 4.22–5.81)
RBC: 3.92 MIL/uL — AB (ref 4.22–5.81)
RDW: 13.3 % (ref 11.5–15.5)
RDW: 13.3 % (ref 11.5–15.5)
WBC: 6.7 10*3/uL (ref 4.0–10.5)
WBC: 15 10*3/uL — AB (ref 4.0–10.5)

## 2018-04-22 LAB — COMPREHENSIVE METABOLIC PANEL
ALT: 17 U/L (ref 0–44)
AST: 24 U/L (ref 15–41)
Albumin: 3.6 g/dL (ref 3.5–5.0)
Alkaline Phosphatase: 41 U/L (ref 38–126)
Anion gap: 15 (ref 5–15)
BILIRUBIN TOTAL: 0.6 mg/dL (ref 0.3–1.2)
BUN: 13 mg/dL (ref 6–20)
CHLORIDE: 111 mmol/L (ref 98–111)
CO2: 18 mmol/L — ABNORMAL LOW (ref 22–32)
CREATININE: 1.56 mg/dL — AB (ref 0.61–1.24)
Calcium: 8 mg/dL — ABNORMAL LOW (ref 8.9–10.3)
GFR calc Af Amer: >60 mL/min (ref >60)
GFR, EST NON AFRICAN AMERICAN: 59 mL/min — AB (ref >60)
Glucose, Bld: 164 mg/dL — ABNORMAL HIGH (ref 70–99)
Potassium: 2.6 mmol/L — CL (ref 3.5–5.1)
Sodium: 144 mmol/L (ref 135–145)
TOTAL PROTEIN: 6.3 g/dL — AB (ref 6.5–8.1)

## 2018-04-22 LAB — PROTIME-INR
INR: 1.23
Prothrombin Time: 15.4 seconds — ABNORMAL HIGH (ref 11.4–15.2)

## 2018-04-22 LAB — POCT ACTIVATED CLOTTING TIME
ACTIVATED CLOTTING TIME: 125 s
Activated Clotting Time: 197 seconds

## 2018-04-22 LAB — ABO/RH
ABO/RH(D): B POS
ABO/RH(D): B POS

## 2018-04-22 LAB — PREPARE RBC (CROSSMATCH)

## 2018-04-22 LAB — ETHANOL: ALCOHOL ETHYL (B): 158 mg/dL — AB (ref <10)

## 2018-04-22 LAB — MAGNESIUM: Magnesium: 1.7 mg/dL (ref 1.7–2.4)

## 2018-04-22 LAB — CREATININE, SERUM
CREATININE: 1.34 mg/dL — AB (ref 0.61–1.24)
GFR calc non Af Amer: >60 mL/min (ref >60)

## 2018-04-22 SURGERY — EXPLORATION, ARTERY, FEMORAL
Anesthesia: General | Laterality: Left

## 2018-04-22 MED ORDER — CEFAZOLIN SODIUM-DEXTROSE 2-4 GM/100ML-% IV SOLN
2.0000 g | Freq: Three times a day (TID) | INTRAVENOUS | Status: AC
  Administered 2018-04-22 (×2): 2 g via INTRAVENOUS
  Filled 2018-04-22 (×2): qty 100

## 2018-04-22 MED ORDER — LABETALOL HCL 5 MG/ML IV SOLN
10.0000 mg | INTRAVENOUS | Status: DC | PRN
  Administered 2018-04-22: 10 mg via INTRAVENOUS

## 2018-04-22 MED ORDER — PANTOPRAZOLE SODIUM 40 MG PO TBEC
40.0000 mg | DELAYED_RELEASE_TABLET | Freq: Every day | ORAL | Status: DC
  Administered 2018-04-23 – 2018-04-27 (×4): 40 mg via ORAL
  Filled 2018-04-22 (×4): qty 1

## 2018-04-22 MED ORDER — ALBUMIN HUMAN 5 % IV SOLN
INTRAVENOUS | Status: DC | PRN
  Administered 2018-04-22 (×3): via INTRAVENOUS

## 2018-04-22 MED ORDER — DOCUSATE SODIUM 100 MG PO CAPS
100.0000 mg | ORAL_CAPSULE | Freq: Every day | ORAL | Status: DC
  Administered 2018-04-23 – 2018-04-27 (×4): 100 mg via ORAL
  Filled 2018-04-22 (×4): qty 1

## 2018-04-22 MED ORDER — FENTANYL CITRATE (PF) 100 MCG/2ML IJ SOLN
INTRAMUSCULAR | Status: AC
  Administered 2018-04-22: 100 ug
  Filled 2018-04-22: qty 2

## 2018-04-22 MED ORDER — HYDRALAZINE HCL 20 MG/ML IJ SOLN: 5.0000 mg | INTRAMUSCULAR | Status: DC | PRN

## 2018-04-22 MED ORDER — MIDAZOLAM HCL 2 MG/2ML IJ SOLN
INTRAMUSCULAR | Status: DC | PRN
  Administered 2018-04-22: 2 mg via INTRAVENOUS

## 2018-04-22 MED ORDER — METOPROLOL TARTRATE 5 MG/5ML IV SOLN: 2.0000 mg | INTRAVENOUS | Status: DC | PRN

## 2018-04-22 MED ORDER — ROCURONIUM BROMIDE 100 MG/10ML IV SOLN
INTRAVENOUS | Status: DC | PRN
  Administered 2018-04-22: 40 mg via INTRAVENOUS

## 2018-04-22 MED ORDER — ACETAMINOPHEN 650 MG RE SUPP: 325.0000 mg | RECTAL | Status: DC | PRN

## 2018-04-22 MED ORDER — LIDOCAINE 2% (20 MG/ML) 5 ML SYRINGE
INTRAMUSCULAR | Status: AC
  Filled 2018-04-22: qty 5

## 2018-04-22 MED ORDER — LACTATED RINGERS IV SOLN: INTRAVENOUS | Status: DC

## 2018-04-22 MED ORDER — ACETAMINOPHEN 325 MG PO TABS: 325.0000 mg | ORAL_TABLET | ORAL | Status: DC | PRN

## 2018-04-22 MED ORDER — SUCCINYLCHOLINE CHLORIDE 200 MG/10ML IV SOSY
PREFILLED_SYRINGE | INTRAVENOUS | Status: AC
  Filled 2018-04-22: qty 10

## 2018-04-22 MED ORDER — MIDAZOLAM HCL 2 MG/2ML IJ SOLN
INTRAMUSCULAR | Status: AC
  Filled 2018-04-22: qty 2

## 2018-04-22 MED ORDER — BISACODYL 5 MG PO TBEC: 5.0000 mg | DELAYED_RELEASE_TABLET | Freq: Every day | ORAL | Status: DC | PRN

## 2018-04-22 MED ORDER — SODIUM CHLORIDE 0.9% FLUSH: 9.0000 mL | INTRAVENOUS | Status: DC | PRN

## 2018-04-22 MED ORDER — SENNOSIDES-DOCUSATE SODIUM 8.6-50 MG PO TABS
1.0000 | ORAL_TABLET | Freq: Every evening | ORAL | Status: DC | PRN
  Administered 2018-04-25 – 2018-04-26 (×2): 1 via ORAL
  Filled 2018-04-22 (×2): qty 1

## 2018-04-22 MED ORDER — CEFAZOLIN SODIUM 1 G IJ SOLR
INTRAMUSCULAR | Status: AC
  Filled 2018-04-22: qty 20

## 2018-04-22 MED ORDER — PHENYLEPHRINE HCL 10 MG/ML IJ SOLN
INTRAMUSCULAR | Status: DC | PRN
  Administered 2018-04-22: 50 ug/min via INTRAVENOUS

## 2018-04-22 MED ORDER — SODIUM CHLORIDE 0.9 % IV SOLN: 500.0000 mL | Freq: Once | INTRAVENOUS | Status: DC | PRN

## 2018-04-22 MED ORDER — SUCCINYLCHOLINE CHLORIDE 20 MG/ML IJ SOLN
INTRAMUSCULAR | Status: DC | PRN
  Administered 2018-04-22: 140 mg via INTRAVENOUS

## 2018-04-22 MED ORDER — MEPERIDINE HCL 50 MG/ML IJ SOLN: 6.2500 mg | INTRAMUSCULAR | Status: DC | PRN

## 2018-04-22 MED ORDER — PROMETHAZINE HCL 25 MG/ML IJ SOLN: 6.2500 mg | INTRAMUSCULAR | Status: DC | PRN

## 2018-04-22 MED ORDER — DIPHENHYDRAMINE HCL 50 MG/ML IJ SOLN: 12.5000 mg | Freq: Four times a day (QID) | INTRAMUSCULAR | Status: DC | PRN

## 2018-04-22 MED ORDER — PHENYLEPHRINE HCL 10 MG/ML IJ SOLN
INTRAMUSCULAR | Status: DC | PRN
  Administered 2018-04-22 (×2): 80 ug via INTRAVENOUS
  Administered 2018-04-22: 120 ug via INTRAVENOUS

## 2018-04-22 MED ORDER — LIDOCAINE HCL (CARDIAC) PF 100 MG/5ML IV SOSY
PREFILLED_SYRINGE | INTRAVENOUS | Status: DC | PRN
  Administered 2018-04-22: 50 mg via INTRATRACHEAL

## 2018-04-22 MED ORDER — ROCURONIUM BROMIDE 50 MG/5ML IV SOSY
PREFILLED_SYRINGE | INTRAVENOUS | Status: AC
  Filled 2018-04-22: qty 5

## 2018-04-22 MED ORDER — HEMOSTATIC AGENTS (NO CHARGE) OPTIME
TOPICAL | Status: DC | PRN
  Administered 2018-04-22: 1 via TOPICAL

## 2018-04-22 MED ORDER — OXYCODONE HCL 5 MG PO TABS
5.0000 mg | ORAL_TABLET | ORAL | Status: DC | PRN
  Administered 2018-04-25 – 2018-04-27 (×8): 10 mg via ORAL
  Filled 2018-04-22 (×8): qty 2

## 2018-04-22 MED ORDER — ALUM & MAG HYDROXIDE-SIMETH 200-200-20 MG/5ML PO SUSP: 15.0000 mL | ORAL | Status: DC | PRN

## 2018-04-22 MED ORDER — FENTANYL CITRATE (PF) 250 MCG/5ML IJ SOLN
INTRAMUSCULAR | Status: AC
  Filled 2018-04-22: qty 5

## 2018-04-22 MED ORDER — HEPARIN SODIUM (PORCINE) 5000 UNIT/ML IJ SOLN
5000.0000 [IU] | Freq: Three times a day (TID) | INTRAMUSCULAR | Status: DC
  Administered 2018-04-22 – 2018-04-27 (×13): 5000 [IU] via SUBCUTANEOUS
  Filled 2018-04-22 (×13): qty 1

## 2018-04-22 MED ORDER — ONDANSETRON HCL 4 MG/2ML IJ SOLN
INTRAMUSCULAR | Status: DC | PRN
  Administered 2018-04-22: 4 mg via INTRAVENOUS

## 2018-04-22 MED ORDER — FENTANYL CITRATE (PF) 100 MCG/2ML IJ SOLN
INTRAMUSCULAR | Status: AC
  Administered 2018-04-22: 50 ug via INTRAVENOUS
  Filled 2018-04-22: qty 2

## 2018-04-22 MED ORDER — GUAIFENESIN-DM 100-10 MG/5ML PO SYRP: 15.0000 mL | ORAL_SOLUTION | ORAL | Status: DC | PRN

## 2018-04-22 MED ORDER — SODIUM CHLORIDE 0.9 % IV SOLN
INTRAVENOUS | Status: AC
  Filled 2018-04-22: qty 1.2

## 2018-04-22 MED ORDER — PHENOL 1.4 % MT LIQD: 1.0000 | OROMUCOSAL | Status: DC | PRN

## 2018-04-22 MED ORDER — THROMBIN 5000 UNITS EX SOLR
CUTANEOUS | Status: AC
  Filled 2018-04-22: qty 20000

## 2018-04-22 MED ORDER — DEXAMETHASONE SODIUM PHOSPHATE 10 MG/ML IJ SOLN
INTRAMUSCULAR | Status: AC
  Filled 2018-04-22: qty 1

## 2018-04-22 MED ORDER — DEXAMETHASONE SODIUM PHOSPHATE 10 MG/ML IJ SOLN
INTRAMUSCULAR | Status: DC | PRN
  Administered 2018-04-22: 10 mg via INTRAVENOUS

## 2018-04-22 MED ORDER — HEPARIN SODIUM (PORCINE) 1000 UNIT/ML IJ SOLN
INTRAMUSCULAR | Status: DC | PRN
  Administered 2018-04-22: 8000 [IU] via INTRAVENOUS
  Administered 2018-04-22: 4000 [IU] via INTRAVENOUS

## 2018-04-22 MED ORDER — HEPARIN SODIUM (PORCINE) 1000 UNIT/ML IJ SOLN
INTRAMUSCULAR | Status: AC
  Filled 2018-04-22: qty 2

## 2018-04-22 MED ORDER — POTASSIUM CHLORIDE CRYS ER 20 MEQ PO TBCR: 20.0000 meq | EXTENDED_RELEASE_TABLET | Freq: Every day | ORAL | Status: DC | PRN

## 2018-04-22 MED ORDER — FENTANYL CITRATE (PF) 100 MCG/2ML IJ SOLN
INTRAMUSCULAR | Status: DC | PRN
  Administered 2018-04-22: 50 ug via INTRAVENOUS
  Administered 2018-04-22: 100 ug via INTRAVENOUS
  Administered 2018-04-22: 50 ug via INTRAVENOUS
  Administered 2018-04-22: 100 ug via INTRAVENOUS
  Administered 2018-04-22: 50 ug via INTRAVENOUS

## 2018-04-22 MED ORDER — HYDROMORPHONE HCL 1 MG/ML IJ SOLN
0.2500 mg | INTRAMUSCULAR | Status: DC | PRN
  Administered 2018-04-22 (×2): 0.5 mg via INTRAVENOUS

## 2018-04-22 MED ORDER — PROPOFOL 10 MG/ML IV BOLUS
INTRAVENOUS | Status: DC | PRN
  Administered 2018-04-22: 140 mg via INTRAVENOUS
  Administered 2018-04-22: 40 mg via INTRAVENOUS

## 2018-04-22 MED ORDER — SULFACETAMIDE SODIUM 10 % OP SOLN: 1.0000 [drp] | OPHTHALMIC | Status: DC

## 2018-04-22 MED ORDER — CEFAZOLIN SODIUM-DEXTROSE 2-3 GM-%(50ML) IV SOLR
INTRAVENOUS | Status: DC | PRN
  Administered 2018-04-22: 2 g via INTRAVENOUS

## 2018-04-22 MED ORDER — NALOXONE HCL 0.4 MG/ML IJ SOLN: 0.4000 mg | INTRAMUSCULAR | Status: DC | PRN

## 2018-04-22 MED ORDER — LACTATED RINGERS IV SOLN
INTRAVENOUS | Status: DC | PRN
  Administered 2018-04-22 (×2): via INTRAVENOUS

## 2018-04-22 MED ORDER — ONDANSETRON HCL 4 MG/2ML IJ SOLN
INTRAMUSCULAR | Status: AC
  Filled 2018-04-22: qty 2

## 2018-04-22 MED ORDER — SODIUM CHLORIDE 0.9 % IV SOLN
INTRAVENOUS | Status: DC | PRN
  Administered 2018-04-22: 500 mL

## 2018-04-22 MED ORDER — POTASSIUM CHLORIDE 10 MEQ/100ML IV SOLN: 10.0000 meq | INTRAVENOUS | Status: DC

## 2018-04-22 MED ORDER — ONDANSETRON HCL 4 MG/2ML IJ SOLN
4.0000 mg | Freq: Four times a day (QID) | INTRAMUSCULAR | Status: DC | PRN
  Administered 2018-04-25: 4 mg via INTRAVENOUS
  Filled 2018-04-22: qty 2

## 2018-04-22 MED ORDER — FENTANYL CITRATE (PF) 100 MCG/2ML IJ SOLN
50.0000 ug | Freq: Once | INTRAMUSCULAR | Status: AC
  Administered 2018-04-22: 50 ug via INTRAVENOUS

## 2018-04-22 MED ORDER — MAGNESIUM SULFATE 2 GM/50ML IV SOLN: 2.0000 g | Freq: Every day | INTRAVENOUS | Status: DC | PRN

## 2018-04-22 MED ORDER — PROPOFOL 10 MG/ML IV BOLUS
INTRAVENOUS | Status: AC
  Filled 2018-04-22: qty 20

## 2018-04-22 MED ORDER — PROTAMINE SULFATE 10 MG/ML IV SOLN
INTRAVENOUS | Status: DC | PRN
  Administered 2018-04-22: 20 mg via INTRAVENOUS

## 2018-04-22 MED ORDER — CALCIUM CHLORIDE 10 % IV SOLN
INTRAVENOUS | Status: DC | PRN
  Administered 2018-04-22: 1 g via INTRAVENOUS
  Administered 2018-04-22: 0.5 g via INTRAVENOUS

## 2018-04-22 MED ORDER — HYDROMORPHONE HCL 1 MG/ML IJ SOLN
INTRAMUSCULAR | Status: AC
  Administered 2018-04-22: 0.5 mg via INTRAVENOUS
  Filled 2018-04-22: qty 1

## 2018-04-22 MED ORDER — PHENYLEPHRINE 40 MCG/ML (10ML) SYRINGE FOR IV PUSH (FOR BLOOD PRESSURE SUPPORT)
PREFILLED_SYRINGE | INTRAVENOUS | Status: AC
  Filled 2018-04-22: qty 10

## 2018-04-22 MED ORDER — MORPHINE SULFATE 2 MG/ML IV SOLN
INTRAVENOUS | Status: DC
  Administered 2018-04-22: 12:00:00 via INTRAVENOUS
  Administered 2018-04-22: 1.5 mg via INTRAVENOUS
  Administered 2018-04-22: 7.5 mg via INTRAVENOUS
  Administered 2018-04-22: 1.5 mg via INTRAVENOUS
  Administered 2018-04-23: 4.5 mg via INTRAVENOUS
  Administered 2018-04-23: 1.5 mg via INTRAVENOUS
  Administered 2018-04-23: 3 mg via INTRAVENOUS
  Administered 2018-04-23: 1.5 mg via INTRAVENOUS
  Administered 2018-04-23: 6 mg via INTRAVENOUS
  Administered 2018-04-23: 3 mg via INTRAVENOUS
  Administered 2018-04-24: 1.5 mg via INTRAVENOUS
  Filled 2018-04-22 (×3): qty 30

## 2018-04-22 MED ORDER — SODIUM CHLORIDE 0.9 % IV SOLN
INTRAVENOUS | Status: DC | PRN
  Administered 2018-04-22: 07:00:00 via INTRAVENOUS

## 2018-04-22 MED ORDER — SODIUM CHLORIDE 0.9% IV SOLUTION: Freq: Once | INTRAVENOUS | Status: DC

## 2018-04-22 MED ORDER — 0.9 % SODIUM CHLORIDE (POUR BTL) OPTIME
TOPICAL | Status: DC | PRN
  Administered 2018-04-22: 2000 mL

## 2018-04-22 MED ORDER — LABETALOL HCL 5 MG/ML IV SOLN
INTRAVENOUS | Status: AC
  Administered 2018-04-22: 10 mg via INTRAVENOUS
  Filled 2018-04-22: qty 4

## 2018-04-22 MED ORDER — SODIUM CHLORIDE 0.9 % IV SOLN
INTRAVENOUS | Status: DC
  Administered 2018-04-22 – 2018-04-26 (×4): via INTRAVENOUS

## 2018-04-22 MED ORDER — DIPHENHYDRAMINE HCL 12.5 MG/5ML PO ELIX
12.5000 mg | ORAL_SOLUTION | Freq: Four times a day (QID) | ORAL | Status: DC | PRN
  Filled 2018-04-22: qty 5

## 2018-04-22 SURGICAL SUPPLY — 69 items
BANDAGE ESMARK 6X9 LF (GAUZE/BANDAGES/DRESSINGS) IMPLANT
BNDG CMPR 9X6 STRL LF SNTH (GAUZE/BANDAGES/DRESSINGS)
BNDG ESMARK 6X9 LF (GAUZE/BANDAGES/DRESSINGS)
CANISTER SUCT 3000ML PPV (MISCELLANEOUS) ×3 IMPLANT
CANISTER WOUND CARE 500ML ATS (WOUND CARE) ×2 IMPLANT
CLIP VESOCCLUDE MED 24/CT (CLIP) ×3 IMPLANT
CLIP VESOCCLUDE SM WIDE 24/CT (CLIP) ×3 IMPLANT
CONNECTOR Y ATS VAC SYSTEM (MISCELLANEOUS) ×2 IMPLANT
COVER PROBE W GEL 5X96 (DRAPES) ×2 IMPLANT
CUFF TOURNIQUET SINGLE 24IN (TOURNIQUET CUFF) IMPLANT
CUFF TOURNIQUET SINGLE 34IN LL (TOURNIQUET CUFF) IMPLANT
CUFF TOURNIQUET SINGLE 44IN (TOURNIQUET CUFF) IMPLANT
DRAIN CHANNEL 15F RND FF W/TCR (WOUND CARE) IMPLANT
DRAIN CHANNEL 19F RND (DRAIN) ×2 IMPLANT
DRAPE C-ARM 42X72 X-RAY (DRAPES) ×1 IMPLANT
DRAPE EXTREMITY T 121X128X90 (DRAPE) ×2 IMPLANT
DRSG COVADERM 4X14 (GAUZE/BANDAGES/DRESSINGS) ×2 IMPLANT
DRSG VAC ATS LRG SENSATRAC (GAUZE/BANDAGES/DRESSINGS) ×2 IMPLANT
ELECT REM PT RETURN 9FT ADLT (ELECTROSURGICAL) ×3
ELECTRODE REM PT RTRN 9FT ADLT (ELECTROSURGICAL) ×1 IMPLANT
EVACUATOR SILICONE 100CC (DRAIN) IMPLANT
GAUZE SPONGE 2X2 8PLY STRL LF (GAUZE/BANDAGES/DRESSINGS) IMPLANT
GLOVE BIO SURGEON STRL SZ7.5 (GLOVE) ×3 IMPLANT
GLOVE BIOGEL PI IND STRL 6.5 (GLOVE) IMPLANT
GLOVE BIOGEL PI IND STRL 7.0 (GLOVE) IMPLANT
GLOVE BIOGEL PI IND STRL 7.5 (GLOVE) IMPLANT
GLOVE BIOGEL PI IND STRL 8 (GLOVE) ×1 IMPLANT
GLOVE BIOGEL PI INDICATOR 6.5 (GLOVE) ×2
GLOVE BIOGEL PI INDICATOR 7.0 (GLOVE) ×2
GLOVE BIOGEL PI INDICATOR 7.5 (GLOVE) ×6
GLOVE BIOGEL PI INDICATOR 8 (GLOVE) ×2
GLOVE ECLIPSE 6.5 STRL STRAW (GLOVE) ×2 IMPLANT
GLOVE ECLIPSE 7.0 STRL STRAW (GLOVE) ×2 IMPLANT
GLOVE SURG SS PI 7.5 STRL IVOR (GLOVE) ×4 IMPLANT
GOWN STRL REUS W/ TWL LRG LVL3 (GOWN DISPOSABLE) ×2 IMPLANT
GOWN STRL REUS W/ TWL XL LVL3 (GOWN DISPOSABLE) ×2 IMPLANT
GOWN STRL REUS W/TWL LRG LVL3 (GOWN DISPOSABLE) ×12
GOWN STRL REUS W/TWL XL LVL3 (GOWN DISPOSABLE) ×6
HEMOSTAT SURGICEL 2X14 (HEMOSTASIS) ×2 IMPLANT
INSERT FOGARTY SM (MISCELLANEOUS) IMPLANT
KIT BASIN OR (CUSTOM PROCEDURE TRAY) ×3 IMPLANT
KIT TURNOVER KIT B (KITS) ×3 IMPLANT
NS IRRIG 1000ML POUR BTL (IV SOLUTION) ×6 IMPLANT
PACK PERIPHERAL VASCULAR (CUSTOM PROCEDURE TRAY) ×3 IMPLANT
PAD ARMBOARD 7.5X6 YLW CONV (MISCELLANEOUS) ×6 IMPLANT
PAD NEG PRESSURE SENSATRAC (MISCELLANEOUS) ×4 IMPLANT
SPONGE GAUZE 2X2 STER 10/PKG (GAUZE/BANDAGES/DRESSINGS) ×4
SPONGE SURGIFOAM ABS GEL 100 (HEMOSTASIS) ×2 IMPLANT
SUT ETHILON 2 0 PSLX (SUTURE) ×8 IMPLANT
SUT ETHILON 3 0 PS 1 (SUTURE) ×2 IMPLANT
SUT MNCRL AB 4-0 PS2 18 (SUTURE) ×3 IMPLANT
SUT PROLENE 5 0 C 1 24 (SUTURE) ×21 IMPLANT
SUT PROLENE 6 0 BV (SUTURE) ×11 IMPLANT
SUT PROLENE 7 0 BV 1 (SUTURE) IMPLANT
SUT SILK 2 0 PERMA HAND 18 BK (SUTURE) ×1 IMPLANT
SUT SILK 3 0 (SUTURE)
SUT SILK 3-0 18XBRD TIE 12 (SUTURE) IMPLANT
SUT VIC AB 2-0 CT1 27 (SUTURE)
SUT VIC AB 2-0 CT1 TAPERPNT 27 (SUTURE) ×2 IMPLANT
SUT VIC AB 2-0 CTX 36 (SUTURE) ×2 IMPLANT
SUT VIC AB 3-0 SH 27 (SUTURE)
SUT VIC AB 3-0 SH 27X BRD (SUTURE) ×3 IMPLANT
SYR BULB IRRIGATION 50ML (SYRINGE) ×2 IMPLANT
TAPE CLOTH SURG 4X10 WHT LF (GAUZE/BANDAGES/DRESSINGS) ×4 IMPLANT
TOWEL GREEN STERILE (TOWEL DISPOSABLE) ×3 IMPLANT
TRAY FOLEY MTR SLVR 16FR STAT (SET/KITS/TRAYS/PACK) ×3 IMPLANT
TUBING EXTENTION W/L.L. (IV SETS) ×3 IMPLANT
UNDERPAD 30X30 (UNDERPADS AND DIAPERS) ×3 IMPLANT
WATER STERILE IRR 1000ML POUR (IV SOLUTION) ×3 IMPLANT
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
## 2018-04-22 NOTE — ED Notes (Signed)
Dr. Chestine Sporelark vascular surgeon arrived and evaluated pt.

## 2018-04-22 NOTE — Anesthesia Procedure Notes (Signed)
Procedure Name: Intubation Date/Time: 04/22/2018 6:43 AM Performed by: Claudina LickMahony, Trea Latner D, CRNA Pre-anesthesia Checklist: Patient identified, Emergency Drugs available, Suction available, Patient being monitored and Timeout performed Patient Re-evaluated:Patient Re-evaluated prior to induction Oxygen Delivery Method: Circle system utilized Preoxygenation: Pre-oxygenation with 100% oxygen Induction Type: IV induction, Rapid sequence and Cricoid Pressure applied Laryngoscope Size: Miller and 2 Grade View: Grade I Tube type: Subglottic suction tube Tube size: 7.5 mm Number of attempts: 1 Airway Equipment and Method: Stylet Placement Confirmation: ETT inserted through vocal cords under direct vision,  positive ETCO2 and breath sounds checked- equal and bilateral Secured at: 22 cm Tube secured with: Tape Dental Injury: Teeth and Oropharynx as per pre-operative assessment

## 2018-04-22 NOTE — Anesthesia Procedure Notes (Signed)
Arterial Line Insertion Start/End8/17/2019 7:30 AM, 04/22/2018 7:35 AM Performed by: Beryle LatheBrock, Thomas E, MD, Leza Apsey, Howie Illarrie B, CRNA, CRNA  Preanesthetic checklist: patient identified, IV checked, monitors and equipment checked and pre-op evaluation Right, radial was placed Catheter size: 20 G Hand hygiene performed  and maximum sterile barriers used   Attempts: 1 Procedure performed without using ultrasound guided technique. Ultrasound Notes:anatomy identified Following insertion, Biopatch and dressing applied.
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
## 2018-04-22 NOTE — ED Notes (Signed)
Patient's personal belongings given to mother by chaplain .

## 2018-04-22 NOTE — Anesthesia Preprocedure Evaluation (Addendum)
Anesthesia Evaluation  Patient identified by MRN, date of birth, ID band Patient awake    Reviewed: Unable to perform ROS - Chart review onlyPreop documentation limited or incomplete due to emergent nature of procedure.  Airway Mallampati: IV       Dental  (+) Teeth Intact, Dental Advisory Given   Pulmonary asthma , Current Smoker,    breath sounds clear to auscultation       Cardiovascular  Rhythm:Regular Rate:Tachycardia     Neuro/Psych    GI/Hepatic   Endo/Other    Renal/GU      Musculoskeletal   Abdominal Normal abdominal exam  (+)   Peds  Hematology   Anesthesia Other Findings Pt uncooperative with exam, unable to determine Mallampati and dentition.  Reproductive/Obstetrics                             Lab Results  Component Value Date   WBC 6.7 04/22/2018   HGB 11.9 (L) 04/22/2018   HCT 35.0 (L) 04/22/2018   MCV 88.8 04/22/2018   PLT 252 04/22/2018   Lab Results  Component Value Date   CREATININE 1.70 (H) 04/22/2018   BUN 10 04/22/2018   NA 146 (H) 04/22/2018   K 2.6 (LL) 04/22/2018   CL 107 04/22/2018   CO2 18 (L) 04/22/2018   Lab Results  Component Value Date   INR 1.23 04/22/2018   INR 1.12 02/17/2015     Anesthesia Physical Anesthesia Plan  ASA: II and emergent  Anesthesia Plan: General   Post-op Pain Management:    Induction: Intravenous  PONV Risk Score and Plan: 2 and Ondansetron and Midazolam  Airway Management Planned: Oral ETT  Additional Equipment: None  Intra-op Plan:   Post-operative Plan: Possible Post-op intubation/ventilation  Informed Consent: I have reviewed the patients History and Physical, chart, labs and discussed the procedure including the risks, benefits and alternatives for the proposed anesthesia with the patient or authorized representative who has indicated his/her understanding and acceptance.   Dental advisory  given  Plan Discussed with: CRNA  Anesthesia Plan Comments:         Anesthesia Quick Evaluation
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
## 2018-04-22 NOTE — Anesthesia Postprocedure Evaluation (Signed)
Anesthesia Post Note  Patient: Derek Rivera  Procedure(s) Performed: Left Superfical Femoral Artery Repair with Reversed Interposition Saphenous Vein Graft; Repair Left Femoral Vein; Two Incision Four Compartment Fasciotomy Left Lower Leg (Left )     Patient location during evaluation: PACU Anesthesia Type: General Level of consciousness: awake and alert Pain management: pain level controlled Vital Signs Assessment: post-procedure vital signs reviewed and stable Respiratory status: spontaneous breathing, nonlabored ventilation and respiratory function stable Cardiovascular status: blood pressure returned to baseline and stable Postop Assessment: no apparent nausea or vomiting Anesthetic complications: no    Last Vitals:  Vitals:   04/22/18 1402 04/22/18 1430  BP:  127/75  Pulse: 85 78  Resp: 13 15  Temp:    SpO2: 99% 100%    Last Pain:  Vitals:   04/22/18 1223  TempSrc: Oral  PainSc:                  Derek Rivera

## 2018-04-22 NOTE — ED Notes (Addendum)
Dr. Chestine Sporelark explained surgery and plan of care to pt. and family , consent for surgery signed .
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
## 2018-04-22 NOTE — ED Notes (Signed)
Pt belongings [facility] to [facility] with [facility] office. Belongings include pants, underwear, 1 shoe, tank top, cell phone, and necklace.
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
## 2018-04-22 NOTE — ED Notes (Signed)
Pt left with [facility] 

## 2018-04-22 NOTE — Anesthesia Procedure Notes (Signed)
Procedure Name: Intubation Date/Time: 04/22/2018 6:29 AM Performed by: Claudina LickMahony, Dawnielle Christiana D, CRNA Pre-anesthesia Checklist: Patient identified, Emergency Drugs available, Suction available, Patient being monitored and Timeout performed Patient Re-evaluated:Patient Re-evaluated prior to induction Oxygen Delivery Method: Circle system utilized Preoxygenation: Pre-oxygenation with 100% oxygen Induction Type: IV induction Ventilation: Mask ventilation without difficulty Laryngoscope Size: Miller and 1 Grade View: Grade I Tube type: Subglottic suction tube Tube size: 7.5 mm Number of attempts: 1 Airway Equipment and Method: Stylet Placement Confirmation: ETT inserted through vocal cords under direct vision,  positive ETCO2 and breath sounds checked- equal and bilateral Secured at: 21 cm Tube secured with: Tape Dental Injury: Teeth and Oropharynx as per pre-operative assessment
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
## 2018-04-22 NOTE — Transfer of Care (Signed)
Immediate Anesthesia Transfer of Care Note  Patient: Derek SnareCarlton P XXXJones  Procedure(s) Performed: Left Superfical Femoral Artery Repair with Reversed Interposition Saphenous Vein Graft; Repair Left Femoral Vein; Two Incision Four Compartment Fasciotomy Left Lower Leg (Left )  Patient Location: PACU  Anesthesia Type:General  Level of Consciousness: responds to stimulation  Airway & Oxygen Therapy: Patient Spontanous Breathing  Post-op Assessment: Report given to RN and Post -op Vital signs reviewed and stable  Post vital signs: Reviewed and stable  Last Vitals:  Vitals Value Taken Time  BP    Temp    Pulse    Resp    SpO2      Last Pain:  Vitals:   04/22/18 0551  PainSc: 0-No pain         Complications: No apparent anesthesia complications

## 2018-04-22 NOTE — ED Notes (Signed)
Potassium 2.6 mmol/L and lactic acid 7.28 mmol/L. Kathrine HaddockAmber Wood, RN, and Melene Planan Floyd, D.O. notified
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
## 2018-04-22 NOTE — ED Notes (Signed)
Transported to OR

## 2018-04-22 NOTE — Progress Notes (Signed)
Patient arrived to 4E room 05 at this time. V/s and assessment done at this time. Telemetry applied and CCMD notified. CHG bath done.   Ernestina ColumbiaK. Starr Cherita Hebel, RN
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
## 2018-04-22 NOTE — ED Notes (Signed)
Pt received a total of 5L of NS. 1 additional L hanging with emergency release blood.
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
## 2018-04-23 ENCOUNTER — Encounter (HOSPITAL_COMMUNITY): Payer: Self-pay | Admitting: Vascular Surgery

## 2018-04-23 LAB — PREPARE FRESH FROZEN PLASMA

## 2018-04-23 LAB — BASIC METABOLIC PANEL
Anion gap: 8 (ref 5–15)
BUN: 9 mg/dL (ref 6–20)
CO2: 22 mmol/L (ref 22–32)
Calcium: 7.9 mg/dL — ABNORMAL LOW (ref 8.9–10.3)
Chloride: 109 mmol/L (ref 98–111)
Creatinine, Ser: 0.76 mg/dL (ref 0.61–1.24)
GFR calc Af Amer: >60 mL/min (ref >60)
GFR calc non Af Amer: >60 mL/min (ref >60)
Glucose, Bld: 130 mg/dL — ABNORMAL HIGH (ref 70–99)
POTASSIUM: 4.2 mmol/L (ref 3.5–5.1)
Sodium: 139 mmol/L (ref 135–145)

## 2018-04-23 LAB — BPAM FFP
BLOOD PRODUCT EXPIRATION DATE: 201908222359
Blood Product Expiration Date: 201908222359
ISSUE DATE / TIME: 201908170707
ISSUE DATE / TIME: 201908170707
UNIT TYPE AND RH: 7300
Unit Type and Rh: 7300

## 2018-04-23 LAB — CBC
HEMATOCRIT: 22 % — AB (ref 39.0–52.0)
Hemoglobin: 7.5 g/dL — ABNORMAL LOW (ref 13.0–17.0)
MCH: 29.8 pg (ref 26.0–34.0)
MCHC: 34.1 g/dL (ref 30.0–36.0)
MCV: 87.3 fL (ref 78.0–100.0)
Platelets: 79 10*3/uL — ABNORMAL LOW (ref 150–400)
RBC: 2.52 MIL/uL — ABNORMAL LOW (ref 4.22–5.81)
RDW: 13.2 % (ref 11.5–15.5)
WBC: 9.2 10*3/uL (ref 4.0–10.5)
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
## 2018-04-23 NOTE — Evaluation (Signed)
Occupational Therapy Evaluation Patient Details Name: Derek Rivera MRN: [id number] DOB: 12/21/1985 Today's Date: 04/23/2018    History of Present Illness This 32 [facility]. [facility]  He underwent repair of Lt superficial femoral artery and Lt femoral vein as well as four compartment fasciotomies.     Clinical Impression   Pt admitted with above. He demonstrates the below listed deficits and will benefit from continued OT to maximize safety and independence with BADLs.  Very limited eval completed due to arrival of detective.   Pt is moving Lt LE well, but was unable to progress to OOB due to session aborted for detective's arrival.  He overall, requires mod - max A for LB ADLs.  He should progress well.  He indicates he has minimal pain.  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  Will continue to follow acutely.       Follow Up Recommendations  No OT follow up;Supervision/Assistance - 24 hour;Supervision - Intermittent    Equipment Recommendations  3 in 1 bedside commode;Tub/shower bench    Recommendations for Other Services       Precautions / Restrictions Precautions Precautions: Other (comment);Fall Precaution Comments: wound VAC Restrictions Weight Bearing Restrictions: No      Mobility Bed Mobility               General bed mobility comments: Pt able to left LE to move it toward EOB, but did not progress to EOB as detective arrived to question pt   Transfers                 General transfer comment: did not progress to OOB     Balance                                           ADL either performed or assessed with clinical judgement   ADL Overall ADL's : Needs assistance/impaired Eating/Feeding: Independent   Grooming: Wash/dry hands;Wash/dry face;Oral care;Set up;Sitting   Upper Body  Bathing: Set up;Bed level   Lower Body Bathing: Bed level;Moderate assistance   Upper Body Dressing : Minimal assistance;Sitting   Lower Body Dressing: Total assistance;Bed level     Toilet Transfer Details (indicate cue type and reason): unable to assess due to eval interrupted  Toileting- Clothing Manipulation and Hygiene: Maximal assistance;Bed level               Vision         Perception     Praxis      Pertinent Vitals/Pain Pain Assessment: Faces Faces Pain Scale: Hurts a little bit Pain Location: Lt LE  Pain Descriptors / Indicators: Pressure Pain Intervention(s): Monitored during session;PCA encouraged     Hand Dominance Right   Extremity/Trunk Assessment Upper Extremity Assessment Upper Extremity Assessment: Overall WFL for tasks assessed   Lower Extremity Assessment Lower Extremity Assessment: Defer to PT evaluation(pt able to perform straight leg raise )   Cervical / Trunk Assessment Cervical / Trunk Assessment: Normal   Communication Communication Communication: No difficulties   Cognition Arousal/Alertness: Awake/alert Behavior During Therapy: WFL for tasks assessed/performed Overall Cognitive Status: Within Functional Limits for tasks assessed  General Comments       Exercises     Shoulder Instructions      Home Living Family/patient expects to be discharged to:: Private residence Living Arrangements: Spouse/significant other;Children Available Help at Discharge: Family;Available PRN/intermittently;Available 24 hours/day Type of Home: House Home Access: Stairs to enter Enter[employer] CorporationEntrance Stairs-Number of Steps: 2   Home Layout: One level     Bathroom Shower/Tub: Chief Strategy OfficerTub/shower unit   Bathroom Toilet: Standard     Home Equipment: None   Additional Comments: Lives with children who are 10, 8 and 4 y.o.       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         OT Problem List: Decreased activity tolerance;Decreased knowledge of use of DME or AE;Pain;Decreased safety awareness;Impaired balance (sitting and/or standing)      OT Treatment/Interventions: Self-care/ADL training;DME and/or AE instruction;Therapeutic activities;Patient/family education;Balance training    OT Goals(Current goals can be found in the care plan section) Acute Rehab OT Goals Patient Stated Goal: To get better and get back to normal  OT Goal Formulation: With patient/family Time For Goal Achievement: 05/07/18 Potential to Achieve Goals: Good ADL Goals Pt Will Perform Grooming: with supervision;standing Pt Will Perform Lower Body Bathing: with min guard assist;sit to/from stand;with adaptive equipment Pt Will Perform Lower Body Dressing: with min guard assist;with adaptive equipment;sit to/from stand Pt Will Transfer to Toilet: with supervision;ambulating;grab bars;bedside commode;regular height toilet Pt Will Perform Toileting - Clothing Manipulation and hygiene: with min guard assist;sit to/from stand Pt Will Perform Tub/Shower Transfer: Tub transfer;with min guard assist;tub bench;shower seat;ambulating;rolling walker  OT Frequency: Min 2X/week   Barriers to D/C:            Co-evaluation              AM-PAC PT "6 Clicks" Daily Activity     Outcome Measure Help from another person eating meals?: None Help from another person taking care of personal grooming?: None Help from another person toileting, which includes using toliet, bedpan, or urinal?: A Lot Help from another person bathing (including washing, rinsing, drying)?: A Lot Help from another person to put on and taking off regular upper body clothing?: A Little Help from another person to put on and taking off regular lower body clothing?: A Lot 6 Click Score: 17   End of Session    Activity Tolerance: Other  (comment)(limited due to arrival of detective ) Patient left: in bed;with call bell/phone within reach;with family/visitor present  OT Visit Diagnosis: Pain Pain - Right/Left: Left Pain - part of body: Leg                Time: [id number] OT Time Calculation (min): 18 min Charges:  OT General Charges $OT Visit: 1 Visit OT Evaluation $OT Eval Moderate Complexity: 1 Mod  Larrisha Babineau, OTR/L [phone]   Jeani HawkingConarpe, Christasia Angeletti M 04/23/2018, 4:12 PM

## 2018-04-23 NOTE — Progress Notes (Signed)
PT Cancellation Note  Patient Details Name: Derek Rivera MRN: [id number] DOB: 05/11/1986   Cancelled Treatment:    Reason Eval/Treat Not Completed: Fatigue/lethargy limiting ability to participate. Will follow-up for PT evaluation as schedule permits.  Ina HomesJaclyn Geran Haithcock, PT, DPT Acute Rehab Services  Pager: [phone]  Malachy ChamberJaclyn L Elmo Shumard 04/23/2018, 9:27 AM

## 2018-04-24 LAB — TYPE AND SCREEN
ABO/RH(D): B POS
ANTIBODY SCREEN: NEGATIVE
Unit division: 0
Unit division: 0
Unit division: 0

## 2018-04-24 LAB — BPAM RBC
BLOOD PRODUCT EXPIRATION DATE: 201909202359
BLOOD PRODUCT EXPIRATION DATE: 201909202359
Blood Product Expiration Date: 201909202359
ISSUE DATE / TIME: 201908170427
ISSUE DATE / TIME: 201908170427
ISSUE DATE / TIME: 201908170427
Unit Type and Rh: 9500
Unit Type and Rh: 9500
Unit Type and Rh: 9500

## 2018-04-24 LAB — HEMOGLOBIN AND HEMATOCRIT, BLOOD
HEMATOCRIT: 23.9 % — AB (ref 39.0–52.0)
HEMOGLOBIN: 8.1 g/dL — AB (ref 13.0–17.0)

## 2018-04-24 LAB — CBC
HEMATOCRIT: 20.5 % — AB (ref 39.0–52.0)
HEMOGLOBIN: 6.8 g/dL — AB (ref 13.0–17.0)
MCH: 29.7 pg (ref 26.0–34.0)
MCHC: 33.2 g/dL (ref 30.0–36.0)
MCV: 89.5 fL (ref 78.0–100.0)
Platelets: 85 10*3/uL — ABNORMAL LOW (ref 150–400)
RBC: 2.29 MIL/uL — AB (ref 4.22–5.81)
RDW: 13.1 % (ref 11.5–15.5)
WBC: 8.6 10*3/uL (ref 4.0–10.5)

## 2018-04-24 LAB — PREPARE RBC (CROSSMATCH)

## 2018-04-24 MED ORDER — MORPHINE SULFATE 2 MG/ML IV SOLN
INTRAVENOUS | Status: DC
  Administered 2018-04-24: 08:00:00 via INTRAVENOUS
  Administered 2018-04-24: 10.5 mg via INTRAVENOUS
  Administered 2018-04-24: 4.5 mg via INTRAVENOUS
  Administered 2018-04-24: 9 mg via INTRAVENOUS
  Administered 2018-04-24: 6 mg via INTRAVENOUS
  Administered 2018-04-25: 3 mg via INTRAVENOUS
  Administered 2018-04-25: 7.5 mg via INTRAVENOUS
  Administered 2018-04-25: 6 mg via INTRAVENOUS
  Administered 2018-04-25: 4.5 mg via INTRAVENOUS
  Administered 2018-04-25 (×2): 7.5 mg via INTRAVENOUS
  Administered 2018-04-25: 18:00:00 via INTRAVENOUS
  Administered 2018-04-26: 5 mg via INTRAVENOUS
  Administered 2018-04-26: 22:00:00 via INTRAVENOUS
  Administered 2018-04-26 – 2018-04-27 (×2): 3 mg via INTRAVENOUS
  Filled 2018-04-24: qty 100
  Filled 2018-04-24 (×2): qty 25

## 2018-04-24 MED ORDER — SODIUM CHLORIDE 0.9% IV SOLUTION
Freq: Once | INTRAVENOUS | Status: AC
  Administered 2018-04-24: 250 mL via INTRAVENOUS

## 2018-04-24 MED FILL — Gelatin Absorbable Sponge Size 100: CUTANEOUS | Qty: 1 | Status: AC

## 2018-04-24 MED FILL — Thrombin For Soln 5000 Unit: CUTANEOUS | Qty: 4 | Status: AC

## 2018-04-24 NOTE — Progress Notes (Signed)
1.5 ml of morphine from PCA wasted in sink with Derek Rivera, Charity fundraiserN.  Derek StarksHanna  Clevland Cork, RN

## 2018-04-24 NOTE — Evaluation (Signed)
Physical Therapy Evaluation Patient Details Name: Derek Rivera MRN: [id number] DOB: 08/02/1986 Today's Date: 04/24/2018   History of Present Illness  This 32 [facility]. [facility] and [facility]  He underwent repair of Lt superficial femoral artery and Lt femoral vein as well as four compartment fasciotomies.    Clinical Impression  Pt admitted with above diagnosis. Pt currently with functional limitations due to the deficits listed below (see PT Problem List). Pt was able to ambulate in hallway with RW with good safety.  Pt should progress well.   Pt will benefit from skilled PT to increase their independence and safety with mobility to allow discharge to the venue listed below.      Follow Up Recommendations No PT follow up;Supervision/Assistance - 24 hour    Equipment Recommendations  Rolling walker with 5" wheels;3in1 (PT)    Recommendations for Other Services       Precautions / Restrictions Precautions Precautions: Other (comment);Fall Precaution Comments: wound VAC Restrictions Weight Bearing Restrictions: No      Mobility  Bed Mobility               General bed mobility comments: Pt was in chair on arrival  Transfers Overall transfer level: Needs assistance Equipment used: Rolling walker (2 wheeled) Transfers: Sit to/from Stand Sit to Stand: Supervision         General transfer comment: No assist needed to come to stand  Ambulation/Gait Ambulation/Gait assistance: Min guard;+2 safety/equipment Gait Distance (Feet): 400 Feet Assistive device: Rolling walker (2 wheeled) Gait Pattern/deviations: Step-to pattern;Decreased stance time - left;Decreased weight shift to left;Decreased dorsiflexion - left;Antalgic   Gait velocity interpretation: 1.31 - 2.62 ft/sec, indicative of limited community ambulator General Gait Details: Pt able to weight bear on left LE.  Pt tollerated gait well without incr pain.  Good technique  with RW and steps.  No LOB and no cues needed except to slow down with turns.   Stairs            Wheelchair Mobility    Modified Rankin (Stroke Patients Only)       Balance Overall balance assessment: Needs assistance Sitting-balance support: No upper extremity supported;Feet supported Sitting balance-Leahy Scale: Fair     Standing balance support: Bilateral upper extremity supported;During functional activity Standing balance-Leahy Scale: Poor Standing balance comment: relies on UEs for support but could take one UE off RW to reach for items and adjust gown                             Pertinent Vitals/Pain Pain Assessment: 0-10 Pain Score: 5  Pain Location: Lt LE  Pain Descriptors / Indicators: Pressure Pain Intervention(s): Limited activity within patient's tolerance;Monitored during session;Premedicated before session;Repositioned;PCA encouraged    Home Living Family/patient expects to be discharged to:: Private residence Living Arrangements: Spouse/significant other;Children Available Help at Discharge: Family;Available PRN/intermittently;Available 24 hours/day Type of Home: House Home Access: Stairs to enter Entrance Stairs-Rails: None(none on back; rails in front. prefer back) Entrance Stairs-Number of Steps: 2 Home Layout: One level Home Equipment: Crutches Additional Comments: Lives with children who are 10, 8 and 4 y.o.     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  Can wiggle toes and DF close to neutral.  Knee and hip grossly 3-/5 LLE:  Unable to fully assess due to pain    Cervical / Trunk Assessment Cervical / Trunk Assessment: Normal  Communication   Communication: No difficulties  Cognition Arousal/Alertness: Awake/alert Behavior During Therapy: WFL for tasks assessed/performed Overall Cognitive Status: Within Functional Limits for tasks assessed                                        General Comments General comments (skin integrity, edema, etc.): wound vac left LE    Exercises Other Exercises Other Exercises: stretch for heel cords with sheet shown to pt   Assessment/Plan    PT Assessment Patient needs continued PT services  PT Problem List Decreased activity tolerance;Decreased balance;Decreased strength;Decreased range of motion;Decreased mobility;Decreased knowledge of use of DME;Decreased safety awareness;Decreased knowledge of precautions;Pain;Decreased skin integrity       PT Treatment Interventions DME instruction;Gait training;Functional mobility training;Therapeutic activities;Stair training;Therapeutic exercise;Balance training;Patient/family education    PT Goals (Current goals can be found in the Care Plan section)  Acute Rehab PT Goals Patient Stated Goal: To get better and get back to normal  PT Goal Formulation: With patient Time For Goal Achievement: 05/08/18 Potential to Achieve Goals: Good    Frequency Min 5X/week   Barriers to discharge        Co-evaluation               AM-PAC PT "6 Clicks" Daily Activity  Outcome Measure Difficulty turning over in bed (including adjusting bedclothes, sheets and blankets)?: None Difficulty moving from lying on back to sitting on the side of the bed? : None Difficulty sitting down on and standing up from a chair with arms (e.g., wheelchair, bedside commode, etc,.)?: None Help needed moving to and from a bed to chair (including a wheelchair)?: None Help needed walking in hospital room?: A Little Help needed climbing 3-5  steps with a railing? : A Little 6 Click Score: 22    End of Session Equipment Utilized During Treatment: Gait belt Activity Tolerance: Patient tolerated treatment well Patient left: in chair;with call bell/phone within reach;with family/visitor present Nurse Communication: Mobility status PT Visit Diagnosis: Pain;Muscle weakness (generalized) (M62.81) Pain - Right/Left: Left Pain - part of body: Leg    Time: 1032-1101 PT Time Calculation (min) (ACUTE ONLY): 29 min   Charges:   PT Evaluation $PT Eval Moderate Complexity: 1 Mod          Lannis Lichtenwalner,PT Acute Rehabilitation [phone] (pager)   Berline Lopesawn F Kiele Heavrin 04/24/2018, 11:37 AM
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
## 2018-04-24 NOTE — Progress Notes (Signed)
Occupational Therapy Treatment Patient Details Name: Derek Rivera P Surette MRN: [id number] DOB: 06/02/1986 Today's Date: 04/24/2018    History of present illness This 32 y.o. [facility] and [facility]  He underwent repair of Lt superficial femoral artery and Lt femoral vein as well as four compartment fasciotomies.     OT comments  Pt progressing towards established OT goals. Pt performing simulated toilet transfer and functional mobility in hallway with Supervision-Min Guard A and RW. Pt demonstrating high motivation to participate in therapy and return home. Continue to recommend dc home once medically stable. Will continue to follow acutely to address LB ADLs and safe tub transfers.    Follow Up Recommendations  No OT follow up;Supervision/Assistance - 24 hour;Supervision - Intermittent    Equipment Recommendations  3 in 1 bedside commode    Recommendations for Other Services      Precautions / Restrictions Precautions Precautions: Other (comment);Fall Precaution Comments: wound VAC Restrictions Weight Bearing Restrictions: No       Mobility Bed Mobility               General bed mobility comments: Pt was in chair on arrival  Transfers Overall transfer level: Needs assistance Equipment used: Rolling walker (2 wheeled) Transfers: Sit to/from Stand Sit to Stand: Supervision         General transfer comment: No assist needed to come to stand    Balance Overall balance assessment: Needs assistance Sitting-balance support: No upper extremity supported;Feet supported Sitting balance-Leahy Scale: Fair     Standing balance support: Bilateral upper extremity supported;During functional activity Standing balance-Leahy Scale: Poor Standing balance comment: relies on UEs for support but could take one UE off RW to reach for items and adjust gown                           ADL either performed or assessed with clinical  judgement   ADL Overall ADL's : Needs assistance/impaired                         Toilet Transfer: Min guard;Ambulation;RW(Simulated to recliner) StatisticianToilet Transfer Details (indicate cue type and reason): Min Guard A for safety.         Functional mobility during ADLs: Min guard;Rolling walker General ADL Comments: Pt demonstrating increased activity tolerance and able to perform functional mobility in hallway. Min Guard A for  Scientist, product/process developmentsafety     Vision       Perception     Praxis      Cognition Arousal/Alertness: Awake/alert Behavior During Therapy: WFL for tasks assessed/performed Overall Cognitive Status: Within Functional Limits for tasks assessed                                 General Comments: Highly motivated        Exercises Exercises: General Lower Extremity;Other exercises Other Exercises Other Exercises: stretch for heel cords with sheet shown to pt   Shoulder Instructions       General Comments Fiance present. wound vac left LE    Pertinent Vitals/ Pain       Pain Assessment: 0-10 Pain Score: 5  Pain Location: Lt LE  Pain Descriptors / Indicators: Pressure Pain Intervention(s): Monitored during session;Limited activity within patient's tolerance;Repositioned;PCA encouraged  Home Living Family/patient expects to be discharged to:: Private residence  Living Arrangements: Spouse/significant other;Children Available Help at Discharge: Family;Available PRN/intermittently;Available 24 hours/day Type of Home: House Home Access: Stairs to enter Entrance Stairs-Number of Steps: 2 Entrance Stairs-Rails: None(none on back; rails in front. prefer back) Home [employer] Corporationyout: One level     Bathroom Shower/Tub: Chief Strategy OfficerTub/shower unit   Bathroom Toilet: Standard     Home Equipment: Crutches   Additional Comments: Lives with children who are 10, 8 and 4 y.o.       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    Passenger transport managerrequency  Min 2X/week        Progress Toward Goals  OT Goals(current goals can now be found in the care plan section)  Progress towards OT goals: Progressing toward goals  Acute Rehab OT Goals Patient Stated Goal: To get better and get back to normal  OT Goal Formulation: With patient/family Time For Goal Achievement: 05/07/18 Potential to Achieve Goals: Good ADL Goals Pt Will Perform Grooming: with supervision;standing Pt Will Perform Lower Body Bathing: with min guard assist;sit to/from stand;with adaptive equipment Pt Will Perform Lower Body Dressing: with min guard assist;with adaptive equipment;sit to/from stand Pt Will Transfer to Toilet: with supervision;ambulating;grab bars;bedside commode;regular height toilet Pt Will Perform Toileting - Clothing Manipulation and hygiene: with min guard assist;sit to/from stand Pt Will Perform Tub/Shower Transfer: Tub transfer;with min guard assist;tub bench;shower seat;ambulating;rolling walker  Plan Discharge plan remains appropriate;Frequency remains appropriate;Equipment recommendations need to be updated    Co-evaluation                 AM-PAC PT "6 Clicks" Daily Activity     Outcome Measure   Help from another person eating meals?: None Help from another person taking care of personal grooming?: None Help from another person toileting, which includes using toliet, bedpan, or urinal?: A Little Help from another person bathing (including washing, rinsing, drying)?: A Little Help from another person to put on and taking off regular upper body clothing?: A Little Help from another person to put on and taking off regular lower body clothing?: A Little 6 Click Score: 20    End of Session Equipment Utilized During Treatment: Gait belt;Rolling walker  OT Visit Diagnosis: Pain Pain - Right/Left: Left Pain - part of body: Leg   Activity Tolerance Patient tolerated  treatment well   Patient Left in chair;with call bell/phone within reach;with family/visitor present   Nurse Communication Mobility status        Time: 1031-1100 OT Time Calculation (min): 29 min  Charges: OT General Charges $OT Visit: 1 Visit OT Treatments $Self Care/Home Management : 8-22 mins  Dixie Coppa MSOT, OTR/L Acute Rehab Pager: [phone] Office: [phone]   Theodoro GristCharis M Kalysta Kneisley 04/24/2018, 11:58 AM

## 2018-04-25 LAB — CBC
HEMATOCRIT: 26.1 % — AB (ref 39.0–52.0)
HEMOGLOBIN: 8.5 g/dL — AB (ref 13.0–17.0)
MCH: 28.9 pg (ref 26.0–34.0)
MCHC: 32.6 g/dL (ref 30.0–36.0)
MCV: 88.8 fL (ref 78.0–100.0)
Platelets: 103 10*3/uL — ABNORMAL LOW (ref 150–400)
RBC: 2.94 MIL/uL — AB (ref 4.22–5.81)
RDW: 12.8 % (ref 11.5–15.5)
WBC: 8.1 10*3/uL (ref 4.0–10.5)

## 2018-04-25 LAB — BASIC METABOLIC PANEL
ANION GAP: 8 (ref 5–15)
BUN: 7 mg/dL (ref 6–20)
CHLORIDE: 104 mmol/L (ref 98–111)
CO2: 27 mmol/L (ref 22–32)
Calcium: 8.4 mg/dL — ABNORMAL LOW (ref 8.9–10.3)
Creatinine, Ser: 1.09 mg/dL (ref 0.61–1.24)
GFR calc non Af Amer: >60 mL/min (ref >60)
Glucose, Bld: 93 mg/dL (ref 70–99)
Potassium: 3.5 mmol/L (ref 3.5–5.1)
Sodium: 139 mmol/L (ref 135–145)

## 2018-04-25 MED ORDER — ASPIRIN EC 81 MG PO TBEC
81.0000 mg | DELAYED_RELEASE_TABLET | Freq: Every day | ORAL | Status: DC
  Administered 2018-04-25 – 2018-04-27 (×2): 81 mg via ORAL
  Filled 2018-04-25 (×2): qty 1

## 2018-04-25 MED ORDER — SODIUM CHLORIDE 0.9 % IV SOLN
1.5000 g | INTRAVENOUS | Status: AC
  Filled 2018-04-25 (×3): qty 1.5

## 2018-04-25 MED ORDER — CEFAZOLIN SODIUM-DEXTROSE 2-4 GM/100ML-% IV SOLN
2.0000 g | INTRAVENOUS | Status: DC
Start: 2018-04-26 — End: 2018-04-25

## 2018-04-25 NOTE — Progress Notes (Addendum)
[facility] [place]  Subjective  - OOB yesterday and walked in hall.  Calf swelling improving.  No acute issues.   Objective 104/69 61 99.7 F (37.6 C) (Oral) 10 98%  Intake/Output Summary (Last 24 hours) at 04/25/2018 0839 Last data filed at 04/25/2018 0600 Gross per 24 hour  Intake 462.2 ml  Output 3460 ml  Net -2997.8 ml        Palpable DP pulse left LE, sensation improving, motor intact Wound vacs to LLE fasciotomies  Assessment/Planning: POD # 3 Procedure: 1. Left superficial femoral artery repair with reversedsaphenous vein interposition graft 2. Repair of left femoral vein injury 3. Left lower extremitytwoincision fourcompartment fasciotomies  Wound vac change to fasciotomies today.  Palpable DP pulse in left foot.  Plan to return to OR tomorrow for fasciotomy closure.  Hopefully discharge soon after OR.  Walking with PT in hall now.  Responded to transfusion yesterday.   Muscle bellies appear decompressed and plan for OR fasciotomy closures tomorrow schedule permiting.    Derek ShellingChristopher J Rivera 04/25/2018 8:39 AM --  Laboratory Lab Results: Recent Labs    04/24/18 0702 04/24/18 1421 04/25/18 0321  WBC 8.6  --  8.1  HGB 6.8* 8.1* 8.5*  HCT 20.5* 23.9* 26.1*  PLT 85*  --  103*   BMET Recent Labs    04/22/18 2359 04/25/18 0321  NA 139 139  K 4.2 3.5  CL 109 104  CO2 22 27  GLUCOSE 130* 93  BUN 9 7  CREATININE 0.76 1.09  CALCIUM 7.9* 8.4*    COAG Lab Results  Component Value Date   INR 1.23 04/22/2018   INR 1.12 02/17/2015   No results found for: PTT

## 2018-04-25 NOTE — Progress Notes (Signed)
OT Cancellation Note  Patient Details Name: Derek Rivera MRN: [id number] DOB: 06/02/1986   Cancelled Treatment:    Reason Eval/Treat Not Completed: Fatigue/lethargy limiting ability to participate. Pt sleeping heavily, attempted x 2 to see pt. (RN requests OT try later due to pt just being able to get some sleep this afternoon)   Galen ManilaSpencer, Albertina Leise Jeanette 04/25/2018, 1:14 PM

## 2018-04-25 NOTE — Progress Notes (Signed)
Physical Therapy Treatment Patient Details Name: Derek Rivera MRN: [id number] DOB: 05/26/1986 Today's Date: 04/25/2018    History of Present Illness This 32 y.o. male [facility] Lt thigh and [facility]  He underwent repair of Lt superficial femoral artery and Lt femoral vein as well as four compartment fasciotomies.      PT Comments    Pt admitted with above diagnosis. Pt currently with functional limitations due to balance and endurance deficits. Pt was able to ambulate with RW without assist and just min guard assist.  Good safety with RW and no LOB.  Progressing well.    Pt will benefit from skilled PT to increase their independence and safety with mobility to allow discharge to the venue listed below.     Follow Up Recommendations  No PT follow up;Supervision/Assistance - 24 hour     Equipment Recommendations  Rolling walker with 5" wheels;3in1 (PT)    Recommendations for Other Services       Precautions / Restrictions Precautions Precautions: Other (comment);Fall Precaution Comments: wound VAC Restrictions Weight Bearing Restrictions: No    Mobility  Bed Mobility               General bed mobility comments: Pt was in chair on arrival  Transfers Overall transfer level: Needs assistance Equipment used: Rolling walker (2 wheeled) Transfers: Sit to/from Stand Sit to Stand: Supervision         General transfer comment: No assist needed to come to stand  Ambulation/Gait Ambulation/Gait assistance: Min guard;Supervision Gait Distance (Feet): 400 Feet Assistive device: Rolling walker (2 wheeled) Gait Pattern/deviations: Step-to pattern;Decreased stance time - left;Decreased weight shift to left;Decreased dorsiflexion - left;Antalgic   Gait velocity interpretation: 1.31 - 2.62 ft/sec, indicative of limited community ambulator General Gait Details: Pt able to weight bear on left LE.  Pt tollerated gait well without incr pain.  Good  technique with RW and steps.  Pt can take hands off RW and stand statically for up to a minute as well.    Stairs Stairs: (declines practice on steps)           Wheelchair Mobility    Modified Rankin (Stroke Patients Only)       Balance Overall balance assessment: Needs assistance Sitting-balance support: No upper extremity supported;Feet supported Sitting balance-Leahy Scale: Fair     Standing balance support: Bilateral upper extremity supported;During functional activity Standing balance-Leahy Scale: Fair Standing balance comment: relies on UEs for support but can take bil  UEs off RW to reach for items and adjust gown                            Cognition Arousal/Alertness: Awake/alert Behavior During Therapy: WFL for tasks assessed/performed Overall Cognitive Status: Within Functional Limits for tasks assessed                                 General Comments: Highly motivated      Exercises General Exercises - Lower Extremity Ankle Circles/Pumps: AROM;Both;5 reps;Supine Other Exercises Other Exercises: stretch for heel cords with sheet reviewed with pt    General Comments General comments (skin integrity, edema, etc.): fiancee and 2 children  present       Pertinent Vitals/Pain Pain Assessment: Faces Faces Pain Scale: Hurts a little bit Pain Location: Lt LE  Pain Descriptors / Indicators: Pressure Pain  Intervention(s): Limited activity within patient's tolerance;Monitored during session;Repositioned;Premedicated before session;PCA encouraged    Home Living                      Prior Function            PT Goals (current goals can now be found in the care plan section) Acute Rehab PT Goals Patient Stated Goal: To get better and get back to normal  Progress towards PT goals: Progressing toward goals    Frequency    Min 5X/week      PT Plan Current plan remains appropriate    Co-evaluation               AM-PAC PT "6 Clicks" Daily Activity  Outcome Measure  Difficulty turning over in bed (including adjusting bedclothes, sheets and blankets)?: None Difficulty moving from lying on back to sitting on the side of the bed? : None Difficulty sitting down on and standing up from a chair with arms (e.g., wheelchair, bedside commode, etc,.)?: None Help needed moving to and from a bed to chair (including a wheelchair)?: None Help needed walking in hospital room?: A Little Help needed climbing 3-5 steps with a railing? : A Little 6 Click Score: 22    End of Session Equipment Utilized During Treatment: Gait belt Activity Tolerance: Patient tolerated treatment well Patient left: in chair;with call bell/phone within reach;with family/visitor present Nurse Communication: Mobility status PT Visit Diagnosis: Pain;Muscle weakness (generalized) (M62.81) Pain - Right/Left: Left Pain - part of body: Leg     Time: [id number] PT Time Calculation (min) (ACUTE ONLY): 29 min  Charges:  $Gait Training: 23-37 mins                     Entergy CorporationDawn Ermal Haberer,PT Acute Rehabilitation [phone] (pager)    [name] 04/25/2018, 12:02 PM

## 2018-04-26 ENCOUNTER — Inpatient Hospital Stay (HOSPITAL_COMMUNITY): Payer: Self-pay | Admitting: Certified Registered Nurse Anesthetist

## 2018-04-26 ENCOUNTER — Encounter (HOSPITAL_COMMUNITY)
Admission: EM | Disposition: A | Discharge status: Home / Self Care | Payer: Self-pay | Source: Home / Self Care | Attending: Vascular Surgery

## 2018-04-26 ENCOUNTER — Encounter (HOSPITAL_COMMUNITY): Payer: Self-pay | Admitting: *Deleted

## 2018-04-26 HISTORY — PX: DRESSING CHANGE UNDER ANESTHESIA: SHX5237

## 2018-04-26 HISTORY — PX: FASCIOTOMY CLOSURE: SHX5829

## 2018-04-26 LAB — BPAM RBC
BLOOD PRODUCT EXPIRATION DATE: 201909142359
BLOOD PRODUCT EXPIRATION DATE: 201909142359
Blood Product Expiration Date: 201909182359
Blood Product Expiration Date: 201909182359
Blood Product Expiration Date: 201909182359
ISSUE DATE / TIME: 201908170705
ISSUE DATE / TIME: 201908170705
ISSUE DATE / TIME: 201908170917
ISSUE DATE / TIME: 201908190856
UNIT TYPE AND RH: 7300
UNIT TYPE AND RH: 7300
Unit Type and Rh: 7300
Unit Type and Rh: 7300
Unit Type and Rh: 7300

## 2018-04-26 LAB — BASIC METABOLIC PANEL
Anion gap: 7 (ref 5–15)
BUN: 7 mg/dL (ref 6–20)
CHLORIDE: 103 mmol/L (ref 98–111)
CO2: 29 mmol/L (ref 22–32)
CREATININE: 0.98 mg/dL (ref 0.61–1.24)
Calcium: 8.3 mg/dL — ABNORMAL LOW (ref 8.9–10.3)
GFR calc Af Amer: >60 mL/min (ref >60)
GFR calc non Af Amer: >60 mL/min (ref >60)
Glucose, Bld: 100 mg/dL — ABNORMAL HIGH (ref 70–99)
Potassium: 3.8 mmol/L (ref 3.5–5.1)
Sodium: 139 mmol/L (ref 135–145)

## 2018-04-26 LAB — TYPE AND SCREEN
ABO/RH(D): B POS
Antibody Screen: NEGATIVE
UNIT DIVISION: 0
UNIT DIVISION: 0
UNIT DIVISION: 0
Unit division: 0
Unit division: 0

## 2018-04-26 LAB — SURGICAL PCR SCREEN
MRSA, PCR: NEGATIVE
Staphylococcus aureus: NEGATIVE

## 2018-04-26 LAB — CBC
HCT: 25.1 % — ABNORMAL LOW (ref 39.0–52.0)
HEMOGLOBIN: 8.2 g/dL — AB (ref 13.0–17.0)
MCH: 28.9 pg (ref 26.0–34.0)
MCHC: 32.7 g/dL (ref 30.0–36.0)
MCV: 88.4 fL (ref 78.0–100.0)
PLATELETS: 128 10*3/uL — AB (ref 150–400)
RBC: 2.84 MIL/uL — ABNORMAL LOW (ref 4.22–5.81)
RDW: 12.4 % (ref 11.5–15.5)
WBC: 6.7 10*3/uL (ref 4.0–10.5)

## 2018-04-26 SURGERY — FASCIOTOMY CLOSURE
Anesthesia: General | Site: Leg Upper | Laterality: Left

## 2018-04-26 MED ORDER — HYDROMORPHONE HCL 1 MG/ML IJ SOLN: 0.2500 mg | INTRAMUSCULAR | Status: DC | PRN

## 2018-04-26 MED ORDER — ONDANSETRON HCL 4 MG/2ML IJ SOLN
INTRAMUSCULAR | Status: DC | PRN
  Administered 2018-04-26: 4 mg via INTRAVENOUS

## 2018-04-26 MED ORDER — 0.9 % SODIUM CHLORIDE (POUR BTL) OPTIME
TOPICAL | Status: DC | PRN
  Administered 2018-04-26: 1000 mL

## 2018-04-26 MED ORDER — FENTANYL CITRATE (PF) 250 MCG/5ML IJ SOLN
INTRAMUSCULAR | Status: AC
  Filled 2018-04-26: qty 5

## 2018-04-26 MED ORDER — LACTATED RINGERS IV SOLN
INTRAVENOUS | Status: DC | PRN
  Administered 2018-04-26: 17:00:00 via INTRAVENOUS

## 2018-04-26 MED ORDER — FENTANYL CITRATE (PF) 100 MCG/2ML IJ SOLN
INTRAMUSCULAR | Status: DC | PRN
  Administered 2018-04-26: 100 ug via INTRAVENOUS
  Administered 2018-04-26 (×3): 50 ug via INTRAVENOUS

## 2018-04-26 MED ORDER — PROPOFOL 10 MG/ML IV BOLUS
INTRAVENOUS | Status: AC
  Filled 2018-04-26: qty 20

## 2018-04-26 MED ORDER — MEPERIDINE HCL 50 MG/ML IJ SOLN: 6.2500 mg | INTRAMUSCULAR | Status: DC | PRN

## 2018-04-26 MED ORDER — MIDAZOLAM HCL 5 MG/5ML IJ SOLN
INTRAMUSCULAR | Status: DC | PRN
  Administered 2018-04-26: 2 mg via INTRAVENOUS

## 2018-04-26 MED ORDER — DEXAMETHASONE SODIUM PHOSPHATE 10 MG/ML IJ SOLN
INTRAMUSCULAR | Status: DC | PRN
  Administered 2018-04-26: 10 mg via INTRAVENOUS

## 2018-04-26 MED ORDER — MIDAZOLAM HCL 2 MG/2ML IJ SOLN
INTRAMUSCULAR | Status: AC
  Filled 2018-04-26: qty 2

## 2018-04-26 MED ORDER — ONDANSETRON HCL 4 MG/2ML IJ SOLN: 4.0000 mg | Freq: Once | INTRAMUSCULAR | Status: DC | PRN

## 2018-04-26 MED ORDER — MUPIROCIN 2 % EX OINT
1.0000 "application " | TOPICAL_OINTMENT | Freq: Two times a day (BID) | CUTANEOUS | Status: DC
  Administered 2018-04-26 – 2018-04-27 (×2): 1 via NASAL
  Filled 2018-04-26: qty 22

## 2018-04-26 MED ORDER — LIDOCAINE HCL (CARDIAC) PF 100 MG/5ML IV SOSY
PREFILLED_SYRINGE | INTRAVENOUS | Status: DC | PRN
  Administered 2018-04-26: 100 mg via INTRAVENOUS

## 2018-04-26 MED ORDER — PROPOFOL 10 MG/ML IV BOLUS
INTRAVENOUS | Status: DC | PRN
  Administered 2018-04-26: 200 mg via INTRAVENOUS

## 2018-04-26 SURGICAL SUPPLY — 31 items
BAG ISL DRAPE 18X18 STRL (DRAPES) ×2
BAG ISOLATION DRAPE 18X18 (DRAPES) IMPLANT
CANISTER SUCT 3000ML PPV (MISCELLANEOUS) ×4 IMPLANT
COVER SURGICAL LIGHT HANDLE (MISCELLANEOUS) ×2 IMPLANT
DRAPE ISOLATION BAG 18X18 (DRAPES) ×2
DRSG COVADERM 4X10 (GAUZE/BANDAGES/DRESSINGS) ×2 IMPLANT
DRSG COVADERM 4X14 (GAUZE/BANDAGES/DRESSINGS) ×2 IMPLANT
ELECT REM PT RETURN 9FT ADLT (ELECTROSURGICAL) ×4
ELECTRODE REM PT RTRN 9FT ADLT (ELECTROSURGICAL) ×2 IMPLANT
GAUZE SPONGE 4X4 12PLY STRL (GAUZE/BANDAGES/DRESSINGS) ×4 IMPLANT
GLOVE BIO SURGEON STRL SZ7.5 (GLOVE) ×4 IMPLANT
GLOVE BIOGEL PI IND STRL 8 (GLOVE) ×2 IMPLANT
GLOVE BIOGEL PI INDICATOR 8 (GLOVE) ×2
GOWN STRL REUS W/ TWL LRG LVL3 (GOWN DISPOSABLE) ×6 IMPLANT
GOWN STRL REUS W/ TWL XL LVL3 (GOWN DISPOSABLE) ×4 IMPLANT
GOWN STRL REUS W/TWL LRG LVL3 (GOWN DISPOSABLE) ×4
GOWN STRL REUS W/TWL XL LVL3 (GOWN DISPOSABLE) ×4
KIT BASIN OR (CUSTOM PROCEDURE TRAY) ×4 IMPLANT
KIT TURNOVER KIT B (KITS) ×4 IMPLANT
NS IRRIG 1000ML POUR BTL (IV SOLUTION) ×4 IMPLANT
PACK GENERAL/GYN (CUSTOM PROCEDURE TRAY) ×2 IMPLANT
PACK UNIVERSAL I (CUSTOM PROCEDURE TRAY) ×4 IMPLANT
PAD ARMBOARD 7.5X6 YLW CONV (MISCELLANEOUS) ×8 IMPLANT
SUT ETHILON 2 0 PSLX (SUTURE) ×6 IMPLANT
SUT ETHILON 3 0 PS 1 (SUTURE) ×10 IMPLANT
SUT MNCRL AB 4-0 PS2 18 (SUTURE) IMPLANT
SUT VIC AB 2-0 CTX 36 (SUTURE) IMPLANT
SUT VIC AB 3-0 SH 27 (SUTURE)
SUT VIC AB 3-0 SH 27X BRD (SUTURE) IMPLANT
TOWEL GREEN STERILE (TOWEL DISPOSABLE) ×4 IMPLANT
WATER STERILE IRR 1000ML POUR (IV SOLUTION) ×4 IMPLANT
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
## 2018-04-26 NOTE — Anesthesia Postprocedure Evaluation (Signed)
Anesthesia Post Note  Patient: Murvin NatalCarlton P Moquin  Procedure(s) Performed: FASCIOTOMY CLOSURES (Left Leg Lower) DRESSING CHANGE UNDER ANESTHESIA (Left Leg Upper)     Patient location during evaluation: PACU Anesthesia Type: General Level of consciousness: awake and alert Pain management: pain level controlled Vital Signs Assessment: post-procedure vital signs reviewed and stable Respiratory status: spontaneous breathing, nonlabored ventilation and respiratory function stable Cardiovascular status: blood pressure returned to baseline and stable Postop Assessment: no apparent nausea or vomiting Anesthetic complications: no    Last Vitals:  Vitals:   04/26/18 1949 04/26/18 2005  BP: 117/77 119/90  Pulse: (!) 58 64  Resp: 13 12  Temp:    SpO2: 100% 100%    Last Pain:  Vitals:   04/26/18 2005  TempSrc:   PainSc: 5                  Neshawn Aird,W. EDMOND
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
## 2018-04-26 NOTE — Transfer of Care (Signed)
Immediate Anesthesia Transfer of Care Note  Patient: Derek Rivera  Procedure(s) Performed: FASCIOTOMY CLOSURES (Left Leg Lower) DRESSING CHANGE UNDER ANESTHESIA (Left Leg Upper)  Patient Location: PACU  Anesthesia Type:General  Level of Consciousness: awake, alert , oriented and patient cooperative  Airway & Oxygen Therapy: Patient Spontanous Breathing  Post-op Assessment: Report given to RN and Post -op Vital signs reviewed and stable  Post vital signs: Reviewed and stable  Last Vitals:  Vitals Value Taken Time  BP 113/80 04/26/2018  6:28 PM  Temp    Pulse 69 04/26/2018  6:30 PM  Resp 10 04/26/2018  6:30 PM  SpO2 96 % 04/26/2018  6:30 PM  Vitals shown include unvalidated device data.  Last Pain:  Vitals:   04/26/18 1249  TempSrc: Oral  PainSc:          Complications: No apparent anesthesia complications
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
## 2018-04-26 NOTE — Progress Notes (Signed)
   04/26/18 1000  PT Visit Information  Last PT Received On 04/26/18   PT - Assessment/Plan  PT Plan Frequency needs to be updated  PT Frequency (ACUTE ONLY) Min 3X/week  Pt frequency updated to more appropriate frequency given pts progress.  Pt can ambulate with nursing with min guard assist with RW.  PT 3x week.  Thanks.  [facility] Joline Encalada,PT Acute Rehabilitation [phone] (pager)

## 2018-04-26 NOTE — Progress Notes (Signed)
[facility] [place]  Subjective  - Calf swelling improving.  No acute issues.  Walking with PT.     Objective 116/81 (!) 59 98.9 F (37.2 C) (Oral) 18 100%  Intake/Output Summary (Last 24 hours) at 04/26/2018 1325 Last data filed at 04/26/2018 1251 Gross per 24 hour  Intake 955.03 ml  Output 3415 ml  Net -2459.97 ml      General: NAD, well appearing Extremities: Palpable DP pulse left LE, sensation improving, motor intact Wound vacs to LLE fasciotomies  Assessment/Planning: 04/22/18 Procedure: 1. Left superficial femoral artery repair with reversedsaphenous vein interposition graft 2. Repair of left femoral vein injury 3. Left lower extremitytwoincision fourcompartment fasciotomies  Wound vac change to fasciotomies yesterday.  Palpable DP pulse in left foot.  Plan to return to OR today for fasciotomy closure.  Hopefully discharge soon after OR.  Walking with PT.  Drain in thigh out yesterday.      Cephus ShellingChristopher J Tarri Guilfoil 04/26/2018 1:25 PM --  Laboratory Lab Results: Recent Labs    04/25/18 0321 04/26/18 0326  WBC 8.1 6.7  HGB 8.5* 8.2*  HCT 26.1* 25.1*  PLT 103* 128*   BMET Recent Labs    04/25/18 0321 04/26/18 0326  NA 139 139  K 3.5 3.8  CL 104 103  CO2 27 29  GLUCOSE 93 100*  BUN 7 7  CREATININE 1.09 0.98  CALCIUM 8.4* 8.3*    COAG Lab Results  Component Value Date   INR 1.23 04/22/2018   INR 1.12 02/17/2015   No results found for: PTT

## 2018-04-26 NOTE — Anesthesia Procedure Notes (Signed)
Procedure Name: LMA Insertion Date/Time: 04/26/2018 5:29 PM Performed by: Adonis Housekeeperongell, Sherly Brodbeck M, CRNA Pre-anesthesia Checklist: Patient identified, Emergency Drugs available, Suction available and Patient being monitored Patient Re-evaluated:Patient Re-evaluated prior to induction Oxygen Delivery Method: Circle system utilized Preoxygenation: Pre-oxygenation with 100% oxygen Induction Type: IV induction Ventilation: Mask ventilation without difficulty LMA: LMA inserted LMA Size: 5.0 Number of attempts: 1 Placement Confirmation: positive ETCO2 and breath sounds checked- equal and bilateral Tube secured with: Tape Dental Injury: Teeth and Oropharynx as per pre-operative assessment

## 2018-04-26 NOTE — Progress Notes (Signed)
Patient was having increased numbness in left foot and decreased ability to dorsiflex and plantar flex. Patient still has palpable pulse in foot. Paged Dr. Chestine Sporelark and while waiting for him to call back, patient stated it was feeling better. Dr. Chestine Sporelark notified. MD stated to keep an eye on the numbness, okay at this time since improving and palpable pulse. Will continue to monitor.
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
## 2018-04-27 ENCOUNTER — Encounter (HOSPITAL_COMMUNITY): Payer: Self-pay | Admitting: Vascular Surgery

## 2018-04-27 ENCOUNTER — Telehealth: Payer: Self-pay | Admitting: Vascular Surgery

## 2018-04-27 MED ORDER — OXYCODONE HCL 5 MG PO TABS: 5.0000 mg | ORAL_TABLET | Freq: Four times a day (QID) | ORAL | 0 refills | Status: AC | PRN

## 2018-04-27 MED ORDER — MORPHINE SULFATE (PF) 2 MG/ML IV SOLN: 2.0000 mg | INTRAVENOUS | Status: DC | PRN

## 2018-04-27 MED ORDER — ASPIRIN 81 MG PO TBEC: 81.0000 mg | DELAYED_RELEASE_TABLET | Freq: Every day | ORAL | Status: AC
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
## 2018-04-27 NOTE — Telephone Encounter (Signed)
sch appt lvm 05/19/18 1pm suture removal p/o PA
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
## 2018-04-27 NOTE — Progress Notes (Signed)
OT Cancellation Note  Patient Details Name: Derek Rivera MRN: [id number] DOB: 01/27/1986   Cancelled Treatment:    Reason Eval/Treat Not Completed: Fatigue/lethargy limiting ability to participate. Pt sleeping heavily this afternoon  Galen ManilaSpencer, Keaun Schnabel Jeanette 04/27/2018, 3:00 PM
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
## 2018-04-27 NOTE — Progress Notes (Signed)
[facility] [place]  Subjective  - Fasciotomies closed yesterday.  Still has palpable pulse.  Had some numbness in foot after surgery, now resolved per patient.    Objective 121/81 63 98.8 F (37.1 C) (Oral) 14 100%  Intake/Output Summary (Last 24 hours) at 04/27/2018 1145 Last data filed at 04/27/2018 0830 Gross per 24 hour  Intake 1917.03 ml  Output 2190 ml  Net -272.97 ml      General: NAD, well appearing Extremities: Palpable DP pulse left LE, sensation improving, motor intact Fasciotomies closed  Assessment/Planning: 04/22/18 Procedure: 1. Left superficial femoral artery repair with reversedsaphenous vein interposition graft 2. Repair of left femoral vein injury 3. Left lower extremitytwoincision fourcompartment fasciotomies  Fasciotomies closed yesterday.  Will d/c PCA today.  Wants to go home.  If tolerates PO pain medicine will discharge on aspirin.  Then plan to f/u 2-3 weeks for suture removal.       Cephus Shellinghristopher J Clark 04/27/2018 11:45 AM --  Laboratory Lab Results: Recent Labs    04/25/18 0321 04/26/18 0326  WBC 8.1 6.7  HGB 8.5* 8.2*  HCT 26.1* 25.1*  PLT 103* 128*   BMET Recent Labs    04/25/18 0321 04/26/18 0326  NA 139 139  K 3.5 3.8  CL 104 103  CO2 27 29  GLUCOSE 93 100*  BUN 7 7  CREATININE 1.09 0.98  CALCIUM 8.4* 8.3*    COAG Lab Results  Component Value Date   INR 1.23 04/22/2018   INR 1.12 02/17/2015   No results found for: PTT

## 2018-04-27 NOTE — Progress Notes (Signed)
Discharge instructions (including medications) discussed with and copy provided to patient/caregiver 
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
## 2018-05-19 ENCOUNTER — Other Ambulatory Visit: Payer: Self-pay

## 2018-05-19 ENCOUNTER — Ambulatory Visit (INDEPENDENT_AMBULATORY_CARE_PROVIDER_SITE_OTHER): Payer: Self-pay | Admitting: Physician Assistant

## 2018-05-19 VITALS — BP 130/82 | Temp 97.2°F | Resp 20 | Ht 70.0 in | Wt 175.0 lb

## 2018-05-19 DIAGNOSIS — W3400XA Accidental discharge from unspecified firearms or gun, initial encounter: Secondary | ICD-10-CM

## 2018-05-19 DIAGNOSIS — S71139A Puncture wound without foreign body, unspecified thigh, initial encounter: Secondary | ICD-10-CM

## 2018-05-19 DIAGNOSIS — Z48812 Encounter for surgical aftercare following surgery on the circulatory system: Secondary | ICD-10-CM

## 2018-05-19 MED ORDER — OXYCODONE-ACETAMINOPHEN 5-325 MG PO TABS: 1.0000 | ORAL_TABLET | Freq: Four times a day (QID) | ORAL | 0 refills | Status: AC | PRN
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
## 2018-05-22 ENCOUNTER — Other Ambulatory Visit: Payer: Self-pay

## 2018-05-22 DIAGNOSIS — S71139A Puncture wound without foreign body, unspecified thigh, initial encounter: Principal | ICD-10-CM

## 2018-05-22 DIAGNOSIS — W3400XA Accidental discharge from unspecified firearms or gun, initial encounter: Secondary | ICD-10-CM

## 2018-05-30 ENCOUNTER — Inpatient Hospital Stay (INDEPENDENT_AMBULATORY_CARE_PROVIDER_SITE_OTHER): Payer: Self-pay | Admitting: Physician Assistant

## 2018-07-16 ENCOUNTER — Emergency Department (HOSPITAL_COMMUNITY)
Admission: EM | Admit: 2018-07-16 | Discharge: 2018-07-16 | Disposition: A | Discharge status: Home / Self Care | Payer: Self-pay | Attending: Emergency Medicine | Admitting: Emergency Medicine

## 2018-07-16 ENCOUNTER — Encounter (HOSPITAL_COMMUNITY): Payer: Self-pay

## 2018-07-16 ENCOUNTER — Emergency Department (HOSPITAL_BASED_OUTPATIENT_CLINIC_OR_DEPARTMENT_OTHER): Payer: Self-pay

## 2018-07-16 DIAGNOSIS — B353 Tinea pedis: Secondary | ICD-10-CM | POA: Insufficient documentation

## 2018-07-16 DIAGNOSIS — J45909 Unspecified asthma, uncomplicated: Secondary | ICD-10-CM | POA: Insufficient documentation

## 2018-07-16 DIAGNOSIS — I82492 Acute embolism and thrombosis of other specified deep vein of left lower extremity: Secondary | ICD-10-CM | POA: Insufficient documentation

## 2018-07-16 DIAGNOSIS — Z79899 Other long term (current) drug therapy: Secondary | ICD-10-CM | POA: Insufficient documentation

## 2018-07-16 DIAGNOSIS — I82452 Acute embolism and thrombosis of left peroneal vein: Secondary | ICD-10-CM

## 2018-07-16 DIAGNOSIS — R609 Edema, unspecified: Secondary | ICD-10-CM

## 2018-07-16 DIAGNOSIS — M79605 Pain in left leg: Secondary | ICD-10-CM

## 2018-07-16 DIAGNOSIS — F17210 Nicotine dependence, cigarettes, uncomplicated: Secondary | ICD-10-CM | POA: Insufficient documentation

## 2018-07-16 DIAGNOSIS — M7989 Other specified soft tissue disorders: Secondary | ICD-10-CM

## 2018-07-16 MED ORDER — RIVAROXABAN (XARELTO) EDUCATION KIT FOR DVT/PE PATIENTS
PACK | Freq: Once | Status: AC
  Administered 2018-07-16: 16:00:00
  Filled 2018-07-16: qty 1

## 2018-07-16 MED ORDER — RIVAROXABAN 20 MG PO TABS: 20.0000 mg | ORAL_TABLET | Freq: Every day | ORAL | Status: DC

## 2018-07-16 MED ORDER — RIVAROXABAN (XARELTO) VTE STARTER PACK (15 & 20 MG): ORAL_TABLET | ORAL | 0 refills | Status: AC

## 2018-07-16 MED ORDER — CLOTRIMAZOLE 1 % EX CREA: TOPICAL_CREAM | CUTANEOUS | 0 refills | Status: AC

## 2018-07-16 MED ORDER — RIVAROXABAN 15 MG PO TABS
15.0000 mg | ORAL_TABLET | Freq: Two times a day (BID) | ORAL | Status: DC
  Administered 2018-07-16: 15 mg via ORAL
  Filled 2018-07-16: qty 1

## 2018-07-16 NOTE — Progress Notes (Signed)
ANTICOAGULATION CONSULT NOTE - Initial Consult  Pharmacy Consult for Xarelto Indication: DVT  No Known Allergies  Patient Measurements: Height: 5\' 7"  (170.2 cm) Weight: 165 lb (74.8 kg) IBW/kg (Calculated) : 66.1   Vital Signs: Temp: 98.6 F (37 C) (11/10 1127) Temp Source: Oral (11/10 1127) BP: 125/101 (11/10 1127) Pulse Rate: 88 (11/10 1127)  Labs: No results for input(s): HGB, HCT, PLT, APTT, LABPROT, INR, HEPARINUNFRC, HEPRLOWMOCWT, CREATININE, CKTOTAL, CKMB, TROPONINI in the last 72 hours.  CrCl cannot be calculated (Patient's most recent lab result is older than the maximum 21 days allowed.).   Medical History: Past Medical History:  Diagnosis Date  . Asthma     Assessment: 32 yo M presents with acute DVT. To start Xarelto.  Goal of Therapy:  Monitor platelets by anticoagulation protocol: Yes   Plan:  Start Xarelto 15mg  PO BID x 21 days, then switch to 20mg  PO daily  Shakina Choy J 07/16/2018,2:51 PM

## 2018-07-16 NOTE — ED Notes (Signed)
Patient transported to X-ray 
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
## 2018-07-16 NOTE — Discharge Instructions (Addendum)
Your work-up today revealed evidence of DVT.  Please use the blood thinners as we discussed.  Please follow-up with your vascular surgery team.  Please use the antifungal medicine twice a day for the next few weeks to help with the athlete's foot.  If any symptoms change or worsen, please return to the nearest emergency department.    Information on my medicine - XARELTO (rivaroxaban)  WHY WAS XARELTO PRESCRIBED FOR YOU? Xarelto was prescribed to treat blood clots that may have been found in the veins of your legs (deep vein thrombosis) or in your lungs (pulmonary embolism) and to reduce the risk of them occurring again.  WHAT DO YOU NEED TO KNOW ABOUT XARELTO? The starting dose is one 15 mg tablet taken TWICE daily with food for the FIRST 21 DAYS then on 12/1  the dose is changed to one 20 mg tablet taken ONCE A DAY with your evening meal.  DO NOT stop taking Xarelto without talking to the health care provider who prescribed the medication.  Refill your prescription for 20 mg tablets before you run out.  After discharge, you should have regular check-up appointments with your healthcare provider that is prescribing your Xarelto.  In the future your dose may need to be changed if your kidney function changes by a significant amount.  WHAT DO YOU DO IF YOU MISS A DOSE? If you are taking Xarelto TWICE DAILY and you miss a dose, take it as soon as you remember. You may take two 15 mg tablets (total 30 mg) at the same time then resume your regularly scheduled 15 mg twice daily the next day.  If you are taking Xarelto ONCE DAILY and you miss a dose, take it as soon as you remember on the same day then continue your regularly scheduled once daily regimen the next day. Do not take two doses of Xarelto at the same time.   IMPORTANT SAFETY INFORMATION Xarelto is a blood thinner medicine that can cause bleeding. You should call your healthcare provider right away if you experience any of the  following: Bleeding from an injury or your nose that does not stop. Unusual colored urine (red or dark brown) or unusual colored stools (red or black). Unusual bruising for unknown reasons. A serious fall or if you hit your head (even if there is no bleeding).  Some medicines may interact with Xarelto and might increase your risk of bleeding while on Xarelto. To help avoid this, consult your healthcare provider or pharmacist prior to using any new prescription or non-prescription medications, including herbals, vitamins, non-steroidal anti-inflammatory drugs (NSAIDs) and supplements.  This website has more information on Xarelto: VisitDestination.com.br.

## 2018-07-16 NOTE — ED Notes (Signed)
Patient verbalizes understanding of discharge instructions. Opportunity for questioning and answers were provided. Ambulatory at discharge in NAD.  

## 2018-07-16 NOTE — Progress Notes (Signed)
VASCULAR LAB PRELIMINARY  PRELIMINARY  PRELIMINARY  PRELIMINARY  Left lower extremity venous duplex completed.    Preliminary report:  There is acute DVT noted in the left peroneal vein.  Called results to [facility], Lorne Winkels, RVT 07/16/2018, 2:09 PM
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
## 2018-07-16 NOTE — ED Notes (Signed)
Pharmacy notified of need for pharmacist to bring xarelto starter pack and first dose.

## 2018-08-22 ENCOUNTER — Other Ambulatory Visit (HOSPITAL_COMMUNITY): Payer: Self-pay

## 2018-08-22 ENCOUNTER — Ambulatory Visit: Payer: Self-pay | Admitting: Vascular Surgery

## 2018-08-22 ENCOUNTER — Encounter (HOSPITAL_COMMUNITY): Payer: Self-pay

## 2018-08-23 ENCOUNTER — Encounter: Payer: Self-pay | Admitting: Vascular Surgery

## 2019-10-24 ENCOUNTER — Emergency Department (HOSPITAL_COMMUNITY): Payer: Medicaid Other

## 2019-10-24 ENCOUNTER — Emergency Department (HOSPITAL_COMMUNITY)
Admission: EM | Admit: 2019-10-24 | Discharge: 2019-10-25 | Disposition: A | Discharge status: Other Acute Inpatient Hospital | Payer: Medicaid Other | Attending: Emergency Medicine | Admitting: Emergency Medicine

## 2019-10-24 DIAGNOSIS — W3400XA Accidental discharge from unspecified firearms or gun, initial encounter: Secondary | ICD-10-CM | POA: Insufficient documentation

## 2019-10-24 DIAGNOSIS — T07XXXA Unspecified multiple injuries, initial encounter: Secondary | ICD-10-CM | POA: Diagnosis present

## 2019-10-24 DIAGNOSIS — M542 Cervicalgia: Secondary | ICD-10-CM | POA: Diagnosis not present

## 2019-10-24 DIAGNOSIS — Z20822 Contact with and (suspected) exposure to covid-19: Secondary | ICD-10-CM | POA: Insufficient documentation

## 2019-10-24 DIAGNOSIS — S42021B Displaced fracture of shaft of right clavicle, initial encounter for open fracture: Secondary | ICD-10-CM | POA: Diagnosis not present

## 2019-10-24 DIAGNOSIS — S21141A Puncture wound with foreign body of right front wall of thorax without penetration into thoracic cavity, initial encounter: Secondary | ICD-10-CM | POA: Insufficient documentation

## 2019-10-24 DIAGNOSIS — R200 Anesthesia of skin: Secondary | ICD-10-CM | POA: Diagnosis not present

## 2019-10-24 DIAGNOSIS — Y999 Unspecified external cause status: Secondary | ICD-10-CM | POA: Diagnosis not present

## 2019-10-24 DIAGNOSIS — S02609B Fracture of mandible, unspecified, initial encounter for open fracture: Secondary | ICD-10-CM | POA: Diagnosis not present

## 2019-10-24 DIAGNOSIS — Y929 Unspecified place or not applicable: Secondary | ICD-10-CM | POA: Insufficient documentation

## 2019-10-24 DIAGNOSIS — Z23 Encounter for immunization: Secondary | ICD-10-CM | POA: Insufficient documentation

## 2019-10-24 DIAGNOSIS — Y939 Activity, unspecified: Secondary | ICD-10-CM | POA: Insufficient documentation

## 2019-10-24 DIAGNOSIS — T1490XA Injury, unspecified, initial encounter: Secondary | ICD-10-CM

## 2019-10-24 LAB — PROTIME-INR
INR: 1 (ref 0.8–1.2)
Prothrombin Time: 13.2 seconds (ref 11.4–15.2)

## 2019-10-24 LAB — COMPREHENSIVE METABOLIC PANEL
ALT: 41 U/L (ref 0–44)
AST: 38 U/L (ref 15–41)
Albumin: 4.3 g/dL (ref 3.5–5.0)
Alkaline Phosphatase: 44 U/L (ref 38–126)
Anion gap: 15 (ref 5–15)
BUN: 11 mg/dL (ref 6–20)
CO2: 22 mmol/L (ref 22–32)
Calcium: 9.3 mg/dL (ref 8.9–10.3)
Chloride: 102 mmol/L (ref 98–111)
Creatinine, Ser: 1.08 mg/dL (ref 0.61–1.24)
GFR calc Af Amer: >60 mL/min (ref >60)
GFR calc non Af Amer: >60 mL/min (ref >60)
Glucose, Bld: 118 mg/dL — ABNORMAL HIGH (ref 70–99)
Potassium: 2.9 mmol/L — ABNORMAL LOW (ref 3.5–5.1)
Sodium: 139 mmol/L (ref 135–145)
Total Bilirubin: 1 mg/dL (ref 0.3–1.2)
Total Protein: 7.6 g/dL (ref 6.5–8.1)

## 2019-10-24 LAB — ETHANOL: Alcohol, Ethyl (B): 186 mg/dL — ABNORMAL HIGH (ref <10)

## 2019-10-24 LAB — CBC
HCT: 43.9 % (ref 39.0–52.0)
Hemoglobin: 14.4 g/dL (ref 13.0–17.0)
MCH: 29.5 pg (ref 26.0–34.0)
MCHC: 32.8 g/dL (ref 30.0–36.0)
MCV: 90 fL (ref 80.0–100.0)
Platelets: 272 10*3/uL (ref 150–400)
RBC: 4.88 MIL/uL (ref 4.22–5.81)
RDW: 13 % (ref 11.5–15.5)
WBC: 12.7 10*3/uL — ABNORMAL HIGH (ref 4.0–10.5)
nRBC: 0 % (ref 0.0–0.2)

## 2019-10-24 LAB — LACTIC ACID, PLASMA: Lactic Acid, Venous: 3.1 mmol/L (ref 0.5–1.9)

## 2019-10-24 LAB — SAMPLE TO BLOOD BANK

## 2019-10-24 LAB — I-STAT CHEM 8, ED
BUN: 12 mg/dL (ref 6–20)
Calcium, Ion: 1.09 mmol/L — ABNORMAL LOW (ref 1.15–1.40)
Chloride: 103 mmol/L (ref 98–111)
Creatinine, Ser: 1.2 mg/dL (ref 0.61–1.24)
Glucose, Bld: 114 mg/dL — ABNORMAL HIGH (ref 70–99)
HCT: 45 % (ref 39.0–52.0)
Hemoglobin: 15.3 g/dL (ref 13.0–17.0)
Potassium: 2.8 mmol/L — ABNORMAL LOW (ref 3.5–5.1)
Sodium: 140 mmol/L (ref 135–145)
TCO2: 26 mmol/L (ref 22–32)

## 2019-10-24 LAB — RESPIRATORY PANEL BY RT PCR (FLU A&B, COVID)
Influenza A by PCR: NEGATIVE
Influenza B by PCR: NEGATIVE
SARS Coronavirus 2 by RT PCR: NEGATIVE

## 2019-10-24 LAB — CDS SEROLOGY

## 2019-10-24 MED ORDER — FENTANYL CITRATE (PF) 100 MCG/2ML IJ SOLN: 50.0000 ug | INTRAMUSCULAR | Status: DC | PRN

## 2019-10-24 MED ORDER — TETANUS-DIPHTH-ACELL PERTUSSIS 5-2.5-18.5 LF-MCG/0.5 IM SUSP
0.5000 mL | Freq: Once | INTRAMUSCULAR | Status: AC
  Administered 2019-10-24: 0.5 mL via INTRAMUSCULAR

## 2019-10-24 MED ORDER — IOHEXOL 350 MG/ML SOLN
80.0000 mL | Freq: Once | INTRAVENOUS | Status: AC | PRN
  Administered 2019-10-24: 23:00:00 80 mL via INTRAVENOUS

## 2019-10-24 MED ORDER — CEFAZOLIN SODIUM-DEXTROSE 2-4 GM/100ML-% IV SOLN
2.0000 g | Freq: Once | INTRAVENOUS | Status: AC
  Administered 2019-10-24: 23:00:00 2 g via INTRAVENOUS
  Filled 2019-10-24: qty 100

## 2019-10-24 MED ORDER — FENTANYL CITRATE (PF) 100 MCG/2ML IJ SOLN
INTRAMUSCULAR | Status: AC
  Filled 2019-10-24: qty 2
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
## 2019-10-24 NOTE — ED Notes (Signed)
Derek Rivera [phone] looking for an update
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
## 2019-10-24 NOTE — ED Notes (Signed)
Called Dr Annalee Genta to Dr Ascencion Dike  [facility] Trauma to Dr Penny Pia

## 2019-10-25 ENCOUNTER — Encounter (HOSPITAL_COMMUNITY): Payer: Self-pay | Admitting: Emergency Medicine

## 2019-10-25 ENCOUNTER — Encounter (HOSPITAL_COMMUNITY): Payer: Self-pay

## 2019-10-25 MED ORDER — FENTANYL CITRATE (PF) 100 MCG/2ML IJ SOLN
100.0000 ug | Freq: Once | INTRAMUSCULAR | Status: AC
  Administered 2019-10-25: 01:00:00 100 ug via INTRAVENOUS
  Filled 2019-10-25: qty 2
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
## 2019-10-25 NOTE — ED Notes (Signed)
E-signature unavailable, pt emergently transferred to [facility]. Report given to [place], RN with [facility] ED charge RN.
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
